# Patient Record
Sex: Male | Born: 1960
Health system: Southern US, Community
[De-identification: ages and names within clinical notes are randomized; demographics above are authoritative.]

## PROBLEM LIST (undated history)

## (undated) DIAGNOSIS — L0291 Cutaneous abscess, unspecified: Secondary | ICD-10-CM

## (undated) DIAGNOSIS — G062 Extradural and subdural abscess, unspecified: Secondary | ICD-10-CM

## (undated) DIAGNOSIS — J45909 Unspecified asthma, uncomplicated: Secondary | ICD-10-CM

---

## 2016-09-03 DIAGNOSIS — J208 Acute bronchitis due to other specified organisms: Secondary | ICD-10-CM | POA: Diagnosis not present

## 2016-09-03 DIAGNOSIS — J019 Acute sinusitis, unspecified: Secondary | ICD-10-CM | POA: Diagnosis not present

## 2016-09-10 DIAGNOSIS — J019 Acute sinusitis, unspecified: Secondary | ICD-10-CM | POA: Diagnosis not present

## 2016-09-10 DIAGNOSIS — J208 Acute bronchitis due to other specified organisms: Secondary | ICD-10-CM | POA: Diagnosis not present

## 2016-12-06 DIAGNOSIS — J019 Acute sinusitis, unspecified: Secondary | ICD-10-CM | POA: Diagnosis not present

## 2016-12-06 DIAGNOSIS — J208 Acute bronchitis due to other specified organisms: Secondary | ICD-10-CM | POA: Diagnosis not present

## 2017-01-15 DIAGNOSIS — K219 Gastro-esophageal reflux disease without esophagitis: Secondary | ICD-10-CM | POA: Diagnosis not present

## 2017-01-15 DIAGNOSIS — M62838 Other muscle spasm: Secondary | ICD-10-CM | POA: Diagnosis not present

## 2017-01-15 DIAGNOSIS — M5030 Other cervical disc degeneration, unspecified cervical region: Secondary | ICD-10-CM | POA: Diagnosis not present

## 2017-01-15 DIAGNOSIS — M47812 Spondylosis without myelopathy or radiculopathy, cervical region: Secondary | ICD-10-CM | POA: Diagnosis not present

## 2017-01-15 DIAGNOSIS — E7800 Pure hypercholesterolemia, unspecified: Secondary | ICD-10-CM | POA: Diagnosis not present

## 2017-01-15 DIAGNOSIS — M542 Cervicalgia: Secondary | ICD-10-CM | POA: Diagnosis not present

## 2017-02-12 DIAGNOSIS — Z0000 Encounter for general adult medical examination without abnormal findings: Secondary | ICD-10-CM | POA: Diagnosis not present

## 2017-02-12 DIAGNOSIS — M4302 Spondylolysis, cervical region: Secondary | ICD-10-CM | POA: Diagnosis not present

## 2017-02-12 DIAGNOSIS — E785 Hyperlipidemia, unspecified: Secondary | ICD-10-CM | POA: Diagnosis not present

## 2017-05-02 DIAGNOSIS — J208 Acute bronchitis due to other specified organisms: Secondary | ICD-10-CM | POA: Diagnosis not present

## 2017-05-02 DIAGNOSIS — J019 Acute sinusitis, unspecified: Secondary | ICD-10-CM | POA: Diagnosis not present

## 2017-05-03 DIAGNOSIS — M4302 Spondylolysis, cervical region: Secondary | ICD-10-CM | POA: Diagnosis not present

## 2017-05-03 DIAGNOSIS — M542 Cervicalgia: Secondary | ICD-10-CM | POA: Diagnosis not present

## 2017-05-14 DIAGNOSIS — M542 Cervicalgia: Secondary | ICD-10-CM | POA: Diagnosis not present

## 2017-05-14 DIAGNOSIS — M5030 Other cervical disc degeneration, unspecified cervical region: Secondary | ICD-10-CM | POA: Diagnosis not present

## 2017-06-05 DIAGNOSIS — Z23 Encounter for immunization: Secondary | ICD-10-CM | POA: Diagnosis not present

## 2017-06-17 DIAGNOSIS — M542 Cervicalgia: Secondary | ICD-10-CM | POA: Diagnosis not present

## 2017-07-08 DIAGNOSIS — J208 Acute bronchitis due to other specified organisms: Secondary | ICD-10-CM | POA: Diagnosis not present

## 2017-07-08 DIAGNOSIS — J019 Acute sinusitis, unspecified: Secondary | ICD-10-CM | POA: Diagnosis not present

## 2017-09-01 DIAGNOSIS — M542 Cervicalgia: Secondary | ICD-10-CM | POA: Diagnosis not present

## 2017-09-08 DIAGNOSIS — M9981 Other biomechanical lesions of cervical region: Secondary | ICD-10-CM | POA: Diagnosis not present

## 2017-09-08 DIAGNOSIS — M47812 Spondylosis without myelopathy or radiculopathy, cervical region: Secondary | ICD-10-CM | POA: Diagnosis not present

## 2017-09-21 DIAGNOSIS — R030 Elevated blood-pressure reading, without diagnosis of hypertension: Secondary | ICD-10-CM | POA: Diagnosis not present

## 2017-09-21 DIAGNOSIS — M47812 Spondylosis without myelopathy or radiculopathy, cervical region: Secondary | ICD-10-CM | POA: Diagnosis not present

## 2017-11-14 DIAGNOSIS — J208 Acute bronchitis due to other specified organisms: Secondary | ICD-10-CM | POA: Diagnosis not present

## 2017-11-14 DIAGNOSIS — J019 Acute sinusitis, unspecified: Secondary | ICD-10-CM | POA: Diagnosis not present

## 2017-12-21 DIAGNOSIS — M5431 Sciatica, right side: Secondary | ICD-10-CM | POA: Diagnosis not present

## 2018-01-12 DIAGNOSIS — J45909 Unspecified asthma, uncomplicated: Secondary | ICD-10-CM | POA: Diagnosis not present

## 2018-01-23 DIAGNOSIS — M5431 Sciatica, right side: Secondary | ICD-10-CM | POA: Diagnosis not present

## 2018-01-23 DIAGNOSIS — M5416 Radiculopathy, lumbar region: Secondary | ICD-10-CM | POA: Diagnosis not present

## 2018-01-24 DIAGNOSIS — M5416 Radiculopathy, lumbar region: Secondary | ICD-10-CM | POA: Diagnosis not present

## 2018-01-24 DIAGNOSIS — M545 Low back pain: Secondary | ICD-10-CM | POA: Diagnosis not present

## 2018-01-24 DIAGNOSIS — J45909 Unspecified asthma, uncomplicated: Secondary | ICD-10-CM | POA: Diagnosis not present

## 2018-01-24 DIAGNOSIS — M25551 Pain in right hip: Secondary | ICD-10-CM | POA: Diagnosis not present

## 2018-01-26 ENCOUNTER — Emergency Department (HOSPITAL_COMMUNITY)
Admission: EM | Admit: 2018-01-26 | Discharge: 2018-01-26 | Disposition: A | Discharge status: Home / Self Care | Payer: 59 | Attending: Emergency Medicine | Admitting: Emergency Medicine

## 2018-01-26 ENCOUNTER — Encounter (HOSPITAL_COMMUNITY): Payer: Self-pay | Admitting: Emergency Medicine

## 2018-01-26 ENCOUNTER — Other Ambulatory Visit: Payer: Self-pay

## 2018-01-26 DIAGNOSIS — M545 Low back pain: Secondary | ICD-10-CM | POA: Diagnosis not present

## 2018-01-26 DIAGNOSIS — F17210 Nicotine dependence, cigarettes, uncomplicated: Secondary | ICD-10-CM | POA: Insufficient documentation

## 2018-01-26 DIAGNOSIS — M5431 Sciatica, right side: Secondary | ICD-10-CM | POA: Diagnosis not present

## 2018-01-26 DIAGNOSIS — M5441 Lumbago with sciatica, right side: Secondary | ICD-10-CM | POA: Diagnosis not present

## 2018-01-26 MED ORDER — OXYCODONE-ACETAMINOPHEN 5-325 MG PO TABS
1.0000 | ORAL_TABLET | ORAL | Status: DC | PRN
  Administered 2018-01-26: 1 via ORAL
  Filled 2018-01-26: qty 1

## 2018-01-26 MED ORDER — KETOROLAC TROMETHAMINE 30 MG/ML IJ SOLN
30.0000 mg | Freq: Once | INTRAMUSCULAR | Status: AC
  Administered 2018-01-26: 30 mg via INTRAVENOUS
  Filled 2018-01-26: qty 1

## 2018-01-26 MED ORDER — HYDROMORPHONE HCL 2 MG/ML IJ SOLN
0.5000 mg | Freq: Once | INTRAMUSCULAR | Status: AC
  Administered 2018-01-26: 0.5 mg via INTRAVENOUS
  Filled 2018-01-26: qty 1

## 2018-01-26 NOTE — Discharge Instructions (Signed)
Please read attached information. If you experience any new or worsening signs or symptoms please return to the emergency room for evaluation. Please follow-up with your primary care provider or specialist as discussed. Please use medication prescribed only as directed and discontinue taking if you have any concerning signs or symptoms.   °

## 2018-01-26 NOTE — ED Notes (Signed)
See EDP assessment 

## 2018-01-26 NOTE — ED Triage Notes (Addendum)
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 Pain worsened over the weekend. Denies any injury. Pt ambulatory, but appears to be in a lot of pain. Pt was seen Tuesday for the same pain and given steroids that have no relieved his pain.

## 2018-01-26 NOTE — ED Provider Notes (Signed)
[facility] EMERGENCY DEPARTMENT Provider Note   CSN: [id number] Arrival date & time: 01/26/18  1625     History   Chief Complaint Chief Complaint  Patient presents with  . Back Pain    HPI Thomas Simpson is a 57 y.o. male.  HPI   57 year old male presents today with complaints of back pain.  Patient reports 1 week history of worsening right lower back pain with radiation down into his buttock and legs.  Patient notes symptoms are worse with movement, worse with elevation in the leg.  Denies any abdominal pain fever, IV drug use, trauma, or any other red flags.  Patient has been seen both by his primary care and orthopedics with negative x-rays of his back and hip.  He notes he was placed on steroids which did not improve his symptoms.  Also taking hydrocodone at home that has not been helping either.  Patient has follow-up appoint with neurosurgery in 1 week.       History reviewed. No pertinent past medical history.  There are no active problems to display for this patient.   History reviewed. No pertinent surgical history.      Home Medications    Prior to Admission medications   Not on File    Family History History reviewed. No pertinent family history.  Social History Social History   Tobacco Use  . Smoking status: Current Every Day Smoker    Types: Cigarettes  . Smokeless tobacco: Never Used  Substance Use Topics  . Alcohol use: Never    Frequency: Never  . Drug use: Never     Allergies   Patient has no known allergies.   Review of Systems Review of Systems  All other systems reviewed and are negative.    Physical Exam Updated Vital Signs BP (!) 188/87 (BP Location: Left Arm)   Pulse 85   Temp 97.7 F (36.5 C) (Oral)   Resp 16   Ht 5\' 9"  (1.753 m)   Wt 99.8 kg (220 lb)   SpO2 98%   BMI 32.49 kg/m   Physical Exam  Constitutional: He is oriented to person, place, and time. He appears well-developed and  well-nourished.  HENT:  Head: Normocephalic and atraumatic.  Eyes: Pupils are equal, round, and reactive to light. Conjunctivae are normal. Right eye exhibits no discharge. Left eye exhibits no discharge. No scleral icterus.  Neck: Normal range of motion. No JVD present. No tracheal deviation present.  Pulmonary/Chest: Effort normal. No stridor.  Musculoskeletal:  No CT or L-spine tenderness palpation, tenderness palpation of the right upper gluteus-bilateral lower extremity sensation strength motor function intact, straight leg positive right, distal perfusion intact  Neurological: He is alert and oriented to person, place, and time. Coordination normal.  Psychiatric: He has a normal mood and affect. His behavior is normal. Judgment and thought content normal.  Nursing note and vitals reviewed.    ED Treatments / Results  Labs (all labs ordered are listed, but only abnormal results are displayed) Labs Reviewed - No data to display  EKG None  Radiology No results found.  Procedures Procedures (including critical care time)  Medications Ordered in ED Medications  oxyCODONE-acetaminophen (PERCOCET/ROXICET) 5-325 MG per tablet 1 tablet (1 tablet Oral Given 01/26/18 1752)  HYDROmorphone (DILAUDID) injection 0.5 mg (0.5 mg Intravenous Given 01/26/18 2136)  ketorolac (TORADOL) 30 MG/ML injection 30 mg (30 mg Intravenous Given 01/26/18 2136)     Initial Impression / Assessment and Plan / ED Course  I have reviewed the triage vital signs and the nursing notes.  Pertinent labs & imaging results that were available during my care of the patient were reviewed by me and considered in my medical decision making (see chart for details).       Final Clinical Impressions(s) / ED Diagnoses   Final diagnoses:  Sciatica of right side    Labs:   Imaging:  Consults:  Therapeutics: Dilaudid, Toradol  Discharge Meds:   Assessment/Plan: 57 year old male presents today with complaints  of low back pain, likely sciatica; no red flags.  Patient has no indications for imaging at this time, but does appear to be acutely uncomfortable.  He will be treated symptomatically here with pain medication discharged with outpatient neurosurgery follow-up and strict return precautions.  Patient verbalized understanding and agreement to today's plan had no further questions or concerns.      ED Discharge Orders    None       Francee Gentile 01/26/18 2158    Charlesetta Shanks, MD 01/27/18 1429

## 2018-01-26 NOTE — ED Notes (Signed)
Pt verbalizes understanding of d/c instructions. Pt taken to lobby in wheelchair at d/c with all belongings and family.

## 2018-01-27 NOTE — ED Notes (Signed)
1.5mg  of Dilaudid wasted with Gretta Cool, RN after pt was discharged.

## 2018-02-02 DIAGNOSIS — M5416 Radiculopathy, lumbar region: Secondary | ICD-10-CM | POA: Diagnosis not present

## 2018-02-06 ENCOUNTER — Other Ambulatory Visit: Payer: Self-pay | Admitting: Student

## 2018-02-06 DIAGNOSIS — M5416 Radiculopathy, lumbar region: Secondary | ICD-10-CM

## 2018-02-08 ENCOUNTER — Ambulatory Visit
Admission: RE | Admit: 2018-02-08 | Discharge: 2018-02-08 | Disposition: A | Discharge status: Home / Self Care | Payer: 59 | Source: Ambulatory Visit | Attending: Student | Admitting: Student

## 2018-02-08 DIAGNOSIS — M5416 Radiculopathy, lumbar region: Secondary | ICD-10-CM

## 2018-02-08 DIAGNOSIS — M48061 Spinal stenosis, lumbar region without neurogenic claudication: Secondary | ICD-10-CM | POA: Diagnosis not present

## 2018-02-16 DIAGNOSIS — M5126 Other intervertebral disc displacement, lumbar region: Secondary | ICD-10-CM | POA: Diagnosis not present

## 2018-02-23 DIAGNOSIS — J45909 Unspecified asthma, uncomplicated: Secondary | ICD-10-CM | POA: Diagnosis not present

## 2018-03-24 DIAGNOSIS — M5126 Other intervertebral disc displacement, lumbar region: Secondary | ICD-10-CM | POA: Diagnosis not present

## 2018-03-24 HISTORY — PX: BACK SURGERY: SHX140

## 2018-04-23 DIAGNOSIS — Z23 Encounter for immunization: Secondary | ICD-10-CM | POA: Diagnosis not present

## 2018-04-26 DIAGNOSIS — J45909 Unspecified asthma, uncomplicated: Secondary | ICD-10-CM | POA: Diagnosis not present

## 2018-04-26 DIAGNOSIS — M5126 Other intervertebral disc displacement, lumbar region: Secondary | ICD-10-CM | POA: Diagnosis not present

## 2018-04-26 DIAGNOSIS — M545 Low back pain: Secondary | ICD-10-CM | POA: Diagnosis not present

## 2018-04-28 DIAGNOSIS — M545 Low back pain: Secondary | ICD-10-CM | POA: Diagnosis not present

## 2018-04-28 DIAGNOSIS — M5126 Other intervertebral disc displacement, lumbar region: Secondary | ICD-10-CM | POA: Diagnosis not present

## 2018-05-02 DIAGNOSIS — M545 Low back pain: Secondary | ICD-10-CM | POA: Diagnosis not present

## 2018-05-02 DIAGNOSIS — M5126 Other intervertebral disc displacement, lumbar region: Secondary | ICD-10-CM | POA: Diagnosis not present

## 2018-05-04 DIAGNOSIS — M5126 Other intervertebral disc displacement, lumbar region: Secondary | ICD-10-CM | POA: Diagnosis not present

## 2018-05-04 DIAGNOSIS — M545 Low back pain: Secondary | ICD-10-CM | POA: Diagnosis not present

## 2018-05-12 DIAGNOSIS — J019 Acute sinusitis, unspecified: Secondary | ICD-10-CM | POA: Diagnosis not present

## 2018-05-22 DIAGNOSIS — J208 Acute bronchitis due to other specified organisms: Secondary | ICD-10-CM | POA: Diagnosis not present

## 2018-05-26 DIAGNOSIS — J45909 Unspecified asthma, uncomplicated: Secondary | ICD-10-CM | POA: Diagnosis not present

## 2018-06-02 DIAGNOSIS — M5126 Other intervertebral disc displacement, lumbar region: Secondary | ICD-10-CM | POA: Diagnosis not present

## 2018-06-26 DIAGNOSIS — J45909 Unspecified asthma, uncomplicated: Secondary | ICD-10-CM | POA: Diagnosis not present

## 2018-07-26 DIAGNOSIS — J45909 Unspecified asthma, uncomplicated: Secondary | ICD-10-CM | POA: Diagnosis not present

## 2018-08-21 DIAGNOSIS — J019 Acute sinusitis, unspecified: Secondary | ICD-10-CM | POA: Diagnosis not present

## 2018-08-21 DIAGNOSIS — Z0000 Encounter for general adult medical examination without abnormal findings: Secondary | ICD-10-CM | POA: Diagnosis not present

## 2018-08-21 DIAGNOSIS — J208 Acute bronchitis due to other specified organisms: Secondary | ICD-10-CM | POA: Diagnosis not present

## 2018-08-22 ENCOUNTER — Other Ambulatory Visit: Payer: Self-pay | Admitting: Neurosurgery

## 2018-08-22 DIAGNOSIS — M5126 Other intervertebral disc displacement, lumbar region: Secondary | ICD-10-CM

## 2018-08-26 DIAGNOSIS — J45909 Unspecified asthma, uncomplicated: Secondary | ICD-10-CM | POA: Diagnosis not present

## 2018-09-05 ENCOUNTER — Ambulatory Visit
Admission: RE | Admit: 2018-09-05 | Discharge: 2018-09-05 | Disposition: A | Discharge status: Home / Self Care | Payer: 59 | Source: Ambulatory Visit | Attending: Neurosurgery | Admitting: Neurosurgery

## 2018-09-05 DIAGNOSIS — M5126 Other intervertebral disc displacement, lumbar region: Secondary | ICD-10-CM

## 2018-09-05 DIAGNOSIS — M545 Low back pain: Secondary | ICD-10-CM | POA: Diagnosis not present

## 2018-09-05 DIAGNOSIS — M5440 Lumbago with sciatica, unspecified side: Secondary | ICD-10-CM | POA: Diagnosis not present

## 2018-09-05 MED ORDER — GADOBENATE DIMEGLUMINE 529 MG/ML IV SOLN
20.0000 mL | Freq: Once | INTRAVENOUS | Status: AC | PRN
  Administered 2018-09-05: 20 mL via INTRAVENOUS

## 2018-09-21 DIAGNOSIS — L578 Other skin changes due to chronic exposure to nonionizing radiation: Secondary | ICD-10-CM | POA: Diagnosis not present

## 2018-09-21 DIAGNOSIS — C44311 Basal cell carcinoma of skin of nose: Secondary | ICD-10-CM | POA: Diagnosis not present

## 2018-09-21 DIAGNOSIS — C44619 Basal cell carcinoma of skin of left upper limb, including shoulder: Secondary | ICD-10-CM | POA: Diagnosis not present

## 2018-09-21 DIAGNOSIS — D2239 Melanocytic nevi of other parts of face: Secondary | ICD-10-CM | POA: Diagnosis not present

## 2018-09-21 DIAGNOSIS — C44319 Basal cell carcinoma of skin of other parts of face: Secondary | ICD-10-CM | POA: Diagnosis not present

## 2018-09-26 DIAGNOSIS — J45909 Unspecified asthma, uncomplicated: Secondary | ICD-10-CM | POA: Diagnosis not present

## 2018-10-06 DIAGNOSIS — M5416 Radiculopathy, lumbar region: Secondary | ICD-10-CM | POA: Diagnosis not present

## 2018-10-12 DIAGNOSIS — C44329 Squamous cell carcinoma of skin of other parts of face: Secondary | ICD-10-CM | POA: Diagnosis not present

## 2018-10-25 DIAGNOSIS — J45909 Unspecified asthma, uncomplicated: Secondary | ICD-10-CM | POA: Diagnosis not present

## 2018-10-26 DIAGNOSIS — M47816 Spondylosis without myelopathy or radiculopathy, lumbar region: Secondary | ICD-10-CM | POA: Diagnosis not present

## 2018-10-26 DIAGNOSIS — Z6829 Body mass index (BMI) 29.0-29.9, adult: Secondary | ICD-10-CM | POA: Diagnosis not present

## 2018-10-27 DIAGNOSIS — Z6832 Body mass index (BMI) 32.0-32.9, adult: Secondary | ICD-10-CM | POA: Diagnosis not present

## 2018-10-27 DIAGNOSIS — G4700 Insomnia, unspecified: Secondary | ICD-10-CM | POA: Diagnosis not present

## 2018-10-27 DIAGNOSIS — M5416 Radiculopathy, lumbar region: Secondary | ICD-10-CM | POA: Diagnosis not present

## 2018-10-30 DIAGNOSIS — K219 Gastro-esophageal reflux disease without esophagitis: Secondary | ICD-10-CM | POA: Diagnosis not present

## 2018-10-30 DIAGNOSIS — M545 Low back pain: Secondary | ICD-10-CM | POA: Diagnosis not present

## 2018-10-30 DIAGNOSIS — G8929 Other chronic pain: Secondary | ICD-10-CM | POA: Diagnosis not present

## 2018-10-30 DIAGNOSIS — R82998 Other abnormal findings in urine: Secondary | ICD-10-CM | POA: Diagnosis not present

## 2018-10-31 DIAGNOSIS — M47816 Spondylosis without myelopathy or radiculopathy, lumbar region: Secondary | ICD-10-CM | POA: Diagnosis not present

## 2018-11-21 DIAGNOSIS — R030 Elevated blood-pressure reading, without diagnosis of hypertension: Secondary | ICD-10-CM | POA: Diagnosis not present

## 2018-11-21 DIAGNOSIS — M47816 Spondylosis without myelopathy or radiculopathy, lumbar region: Secondary | ICD-10-CM | POA: Diagnosis not present

## 2018-11-21 DIAGNOSIS — M961 Postlaminectomy syndrome, not elsewhere classified: Secondary | ICD-10-CM | POA: Diagnosis not present

## 2018-11-22 DIAGNOSIS — L0291 Cutaneous abscess, unspecified: Secondary | ICD-10-CM

## 2018-11-22 HISTORY — DX: Cutaneous abscess, unspecified: L02.91

## 2018-11-25 DIAGNOSIS — J45909 Unspecified asthma, uncomplicated: Secondary | ICD-10-CM | POA: Diagnosis not present

## 2018-11-28 DIAGNOSIS — M5416 Radiculopathy, lumbar region: Secondary | ICD-10-CM | POA: Diagnosis not present

## 2018-11-28 DIAGNOSIS — M47816 Spondylosis without myelopathy or radiculopathy, lumbar region: Secondary | ICD-10-CM | POA: Diagnosis not present

## 2018-11-28 DIAGNOSIS — M5440 Lumbago with sciatica, unspecified side: Secondary | ICD-10-CM | POA: Diagnosis not present

## 2018-12-04 ENCOUNTER — Other Ambulatory Visit: Payer: Self-pay | Admitting: Neurosurgery

## 2018-12-04 DIAGNOSIS — M5416 Radiculopathy, lumbar region: Secondary | ICD-10-CM

## 2018-12-11 ENCOUNTER — Other Ambulatory Visit: Payer: Self-pay

## 2018-12-11 ENCOUNTER — Ambulatory Visit
Admission: RE | Admit: 2018-12-11 | Discharge: 2018-12-11 | Disposition: A | Discharge status: Home / Self Care | Payer: 59 | Source: Ambulatory Visit | Attending: Neurosurgery | Admitting: Neurosurgery

## 2018-12-11 DIAGNOSIS — M545 Low back pain: Secondary | ICD-10-CM | POA: Diagnosis not present

## 2018-12-11 DIAGNOSIS — M5416 Radiculopathy, lumbar region: Secondary | ICD-10-CM

## 2018-12-11 MED ORDER — GADOBENATE DIMEGLUMINE 529 MG/ML IV SOLN
17.0000 mL | Freq: Once | INTRAVENOUS | Status: AC | PRN
  Administered 2018-12-11: 17 mL via INTRAVENOUS

## 2018-12-12 ENCOUNTER — Other Ambulatory Visit: Payer: Self-pay

## 2018-12-12 ENCOUNTER — Other Ambulatory Visit: Payer: Self-pay | Admitting: Neurosurgery

## 2018-12-12 ENCOUNTER — Inpatient Hospital Stay (HOSPITAL_COMMUNITY)
Admission: AD | Admit: 2018-12-12 | Discharge: 2018-12-15 | DRG: 029 | Disposition: A | Discharge status: Home Health | Payer: 59 | Source: Ambulatory Visit | Attending: Neurosurgery | Admitting: Neurosurgery

## 2018-12-12 ENCOUNTER — Encounter (HOSPITAL_COMMUNITY): Payer: Self-pay | Admitting: General Practice

## 2018-12-12 DIAGNOSIS — M4640 Discitis, unspecified, site unspecified: Secondary | ICD-10-CM

## 2018-12-12 DIAGNOSIS — Z7952 Long term (current) use of systemic steroids: Secondary | ICD-10-CM

## 2018-12-12 DIAGNOSIS — G061 Intraspinal abscess and granuloma: Principal | ICD-10-CM | POA: Diagnosis present

## 2018-12-12 DIAGNOSIS — M869 Osteomyelitis, unspecified: Secondary | ICD-10-CM | POA: Diagnosis present

## 2018-12-12 DIAGNOSIS — M4646 Discitis, unspecified, lumbar region: Secondary | ICD-10-CM | POA: Diagnosis present

## 2018-12-12 DIAGNOSIS — F17210 Nicotine dependence, cigarettes, uncomplicated: Secondary | ICD-10-CM | POA: Diagnosis not present

## 2018-12-12 DIAGNOSIS — D72829 Elevated white blood cell count, unspecified: Secondary | ICD-10-CM | POA: Diagnosis not present

## 2018-12-12 DIAGNOSIS — B9562 Methicillin resistant Staphylococcus aureus infection as the cause of diseases classified elsewhere: Secondary | ICD-10-CM | POA: Diagnosis not present

## 2018-12-12 DIAGNOSIS — M4626 Osteomyelitis of vertebra, lumbar region: Secondary | ICD-10-CM | POA: Diagnosis not present

## 2018-12-12 DIAGNOSIS — Z419 Encounter for procedure for purposes other than remedying health state, unspecified: Secondary | ICD-10-CM

## 2018-12-12 DIAGNOSIS — J45909 Unspecified asthma, uncomplicated: Secondary | ICD-10-CM | POA: Diagnosis present

## 2018-12-12 DIAGNOSIS — G062 Extradural and subdural abscess, unspecified: Secondary | ICD-10-CM | POA: Diagnosis not present

## 2018-12-12 DIAGNOSIS — Z720 Tobacco use: Secondary | ICD-10-CM | POA: Diagnosis not present

## 2018-12-12 DIAGNOSIS — M549 Dorsalgia, unspecified: Secondary | ICD-10-CM | POA: Diagnosis not present

## 2018-12-12 DIAGNOSIS — Z95828 Presence of other vascular implants and grafts: Secondary | ICD-10-CM | POA: Diagnosis not present

## 2018-12-12 DIAGNOSIS — Z978 Presence of other specified devices: Secondary | ICD-10-CM | POA: Diagnosis not present

## 2018-12-12 HISTORY — DX: Cutaneous abscess, unspecified: L02.91

## 2018-12-12 HISTORY — DX: Extradural and subdural abscess, unspecified: G06.2

## 2018-12-12 HISTORY — DX: Unspecified asthma, uncomplicated: J45.909

## 2018-12-12 LAB — CBC WITH DIFFERENTIAL/PLATELET
Abs Immature Granulocytes: 0.09 10*3/uL — ABNORMAL HIGH (ref 0.00–0.07)
Basophils Absolute: 0.1 10*3/uL (ref 0.0–0.1)
Basophils Relative: 0 %
Eosinophils Absolute: 0.1 10*3/uL (ref 0.0–0.5)
Eosinophils Relative: 1 %
HCT: 45.5 % (ref 39.0–52.0)
Hemoglobin: 15.6 g/dL (ref 13.0–17.0)
Immature Granulocytes: 1 %
Lymphocytes Relative: 12 %
Lymphs Abs: 1.9 10*3/uL (ref 0.7–4.0)
MCH: 29.6 pg (ref 26.0–34.0)
MCHC: 34.3 g/dL (ref 30.0–36.0)
MCV: 86.3 fL (ref 80.0–100.0)
Monocytes Absolute: 0.6 10*3/uL (ref 0.1–1.0)
Monocytes Relative: 4 %
Neutro Abs: 13.8 10*3/uL — ABNORMAL HIGH (ref 1.7–7.7)
Neutrophils Relative %: 82 %
Platelets: 406 10*3/uL — ABNORMAL HIGH (ref 150–400)
RBC: 5.27 MIL/uL (ref 4.22–5.81)
RDW: 13.2 % (ref 11.5–15.5)
WBC: 16.5 10*3/uL — ABNORMAL HIGH (ref 4.0–10.5)
nRBC: 0 % (ref 0.0–0.2)

## 2018-12-12 LAB — BASIC METABOLIC PANEL
Anion gap: 15 (ref 5–15)
BUN: 12 mg/dL (ref 6–20)
CO2: 23 mmol/L (ref 22–32)
Calcium: 9.7 mg/dL (ref 8.9–10.3)
Chloride: 101 mmol/L (ref 98–111)
Creatinine, Ser: 0.74 mg/dL (ref 0.61–1.24)
GFR calc Af Amer: >60 mL/min (ref >60)
GFR calc non Af Amer: >60 mL/min (ref >60)
Glucose, Bld: 155 mg/dL — ABNORMAL HIGH (ref 70–99)
Potassium: 3.8 mmol/L (ref 3.5–5.1)
Sodium: 139 mmol/L (ref 135–145)

## 2018-12-12 LAB — SEDIMENTATION RATE: Sed Rate: 54 mm/hr — ABNORMAL HIGH (ref 0–16)

## 2018-12-12 LAB — PROTIME-INR
INR: 1.1 (ref 0.8–1.2)
Prothrombin Time: 13.6 seconds (ref 11.4–15.2)

## 2018-12-12 LAB — C-REACTIVE PROTEIN: CRP: 6.7 mg/dL — ABNORMAL HIGH (ref <1.0)

## 2018-12-12 MED ORDER — SODIUM CHLORIDE 0.9% FLUSH: 3.0000 mL | INTRAVENOUS | Status: DC | PRN

## 2018-12-12 MED ORDER — ACETAMINOPHEN 325 MG PO TABS: 650.0000 mg | ORAL_TABLET | ORAL | Status: DC | PRN

## 2018-12-12 MED ORDER — PANTOPRAZOLE SODIUM 40 MG PO TBEC
40.0000 mg | DELAYED_RELEASE_TABLET | Freq: Every day | ORAL | Status: DC
  Administered 2018-12-13: 40 mg via ORAL
  Filled 2018-12-12: qty 1

## 2018-12-12 MED ORDER — METHYLPREDNISOLONE 4 MG PO TABS
4.0000 mg | ORAL_TABLET | Freq: Every day | ORAL | Status: DC
  Administered 2018-12-13 – 2018-12-15 (×3): 4 mg via ORAL
  Filled 2018-12-12 (×3): qty 1

## 2018-12-12 MED ORDER — PANTOPRAZOLE SODIUM 40 MG IV SOLR: 40.0000 mg | Freq: Every day | INTRAVENOUS | Status: DC

## 2018-12-12 MED ORDER — ALUM & MAG HYDROXIDE-SIMETH 200-200-20 MG/5ML PO SUSP: 30.0000 mL | Freq: Four times a day (QID) | ORAL | Status: DC | PRN

## 2018-12-12 MED ORDER — ONDANSETRON HCL 4 MG/2ML IJ SOLN: 4.0000 mg | Freq: Four times a day (QID) | INTRAMUSCULAR | Status: DC | PRN

## 2018-12-12 MED ORDER — ENSURE ENLIVE PO LIQD
237.0000 mL | Freq: Two times a day (BID) | ORAL | Status: DC
  Administered 2018-12-14 – 2018-12-15 (×4): 237 mL via ORAL

## 2018-12-12 MED ORDER — CYCLOBENZAPRINE HCL 10 MG PO TABS
10.0000 mg | ORAL_TABLET | Freq: Three times a day (TID) | ORAL | Status: DC
  Administered 2018-12-13 – 2018-12-15 (×9): 10 mg via ORAL
  Filled 2018-12-12 (×8): qty 1

## 2018-12-12 MED ORDER — NICOTINE 21 MG/24HR TD PT24
21.0000 mg | MEDICATED_PATCH | Freq: Every day | TRANSDERMAL | Status: DC
  Administered 2018-12-13 – 2018-12-15 (×4): 21 mg via TRANSDERMAL
  Filled 2018-12-12 (×4): qty 1

## 2018-12-12 MED ORDER — ACETAMINOPHEN 650 MG RE SUPP: 650.0000 mg | RECTAL | Status: DC | PRN

## 2018-12-12 MED ORDER — HYDROMORPHONE HCL 1 MG/ML IJ SOLN: 0.5000 mg | INTRAMUSCULAR | Status: DC | PRN

## 2018-12-12 MED ORDER — PHENOL 1.4 % MT LIQD: 1.0000 | OROMUCOSAL | Status: DC | PRN

## 2018-12-12 MED ORDER — TRAMADOL HCL 50 MG PO TABS
50.0000 mg | ORAL_TABLET | Freq: Three times a day (TID) | ORAL | Status: DC
  Administered 2018-12-13 – 2018-12-15 (×9): 50 mg via ORAL
  Filled 2018-12-12 (×9): qty 1

## 2018-12-12 MED ORDER — SODIUM CHLORIDE 0.9% FLUSH
3.0000 mL | Freq: Two times a day (BID) | INTRAVENOUS | Status: DC
  Administered 2018-12-12 – 2018-12-14 (×4): 3 mL via INTRAVENOUS

## 2018-12-12 MED ORDER — CYCLOBENZAPRINE HCL 10 MG PO TABS
10.0000 mg | ORAL_TABLET | Freq: Three times a day (TID) | ORAL | Status: DC | PRN
  Administered 2018-12-12 – 2018-12-13 (×2): 10 mg via ORAL
  Filled 2018-12-12 (×3): qty 1

## 2018-12-12 MED ORDER — OXYCODONE HCL 5 MG PO TABS
10.0000 mg | ORAL_TABLET | ORAL | Status: DC | PRN
  Administered 2018-12-12 – 2018-12-15 (×7): 10 mg via ORAL
  Filled 2018-12-12 (×7): qty 2

## 2018-12-12 MED ORDER — ONDANSETRON HCL 4 MG PO TABS: 4.0000 mg | ORAL_TABLET | Freq: Four times a day (QID) | ORAL | Status: DC | PRN

## 2018-12-12 MED ORDER — MENTHOL 3 MG MT LOZG: 1.0000 | LOZENGE | OROMUCOSAL | Status: DC | PRN

## 2018-12-12 MED ORDER — SODIUM CHLORIDE 0.9 % IV SOLN: 250.0000 mL | INTRAVENOUS | Status: DC

## 2018-12-12 NOTE — H&P (Signed)
Thomas Simpson is an 58 y.o. male.   Chief Complaint: Back left hip and leg pain HPI: Patient is a very pleasant 58 year old gentleman who previously undergone a right-sided L4-5 laminectomy microdiscectomy several months ago.  Initially patient did very well however started getting worsening back and little bit of left hip and leg pain had a follow-up MRI scan done in January that showed some spondylosis and was referred for injections.  Had a couple rounds of injections and then after the second injection started to get progressive worse with his back pain had an MRI scan ordered however the patient was not able to go get the MRI scan the first time he was ordered we will reorder that ultimately he got it and the MRI scan is shown what looks like severe discitis osteomyelitis with a component causing an epidural abscess and compression of the left L4 nerve root.  Patient symptomatology is primarily back pain left hip and leg pain that radiates down an L4 distribution.  Patient denies any fevers chills nausea vomiting any other numbness or tingling denies any bowel or bladder difficulty.  Past Medical History:  Diagnosis Date  . Abscess 11/2018   LUMBAR  . Asthma     Past Surgical History:  Procedure Laterality Date  . BACK SURGERY  03/24/2018    History reviewed. No pertinent family history. Social History:  reports that he has been smoking cigarettes. He has a 30.00 pack-year smoking history. He has never used smokeless tobacco. He reports that he does not drink alcohol or use drugs.  Allergies: No Known Allergies  Medications Prior to Admission  Medication Sig Dispense Refill  . cyclobenzaprine (FLEXERIL) 10 MG tablet Take 10 mg by mouth 3 (three) times daily.    Marland Kitchen dexlansoprazole (DEXILANT) 60 MG capsule Take 60 mg by mouth every morning.    . diclofenac (VOLTAREN) 75 MG EC tablet Take 75 mg by mouth every morning.    Marnee Spring Omega-3 Krill Oil 500 MG CAPS Take 500 mg elemental  calcium/kg/hr by mouth every morning.    . methylPREDNISolone (MEDROL) 4 MG tablet Take 4 mg by mouth daily.    . traMADol (ULTRAM) 50 MG tablet Take 50 mg by mouth 3 (three) times daily.      No results found for this or any previous visit (from the past 48 hour(s)). Mr Lumbar Spine W Wo Contrast  Result Date: 12/11/2018 CLINICAL DATA:  Severe low back pain radiating down the left leg for the past 2 months. Prior surgery in August 2019. EXAM: MRI LUMBAR SPINE WITHOUT AND WITH CONTRAST TECHNIQUE: Multiplanar and multiecho pulse sequences of the lumbar spine were obtained without and with intravenous contrast. CONTRAST:  48mL MULTIHANCE GADOBENATE DIMEGLUMINE 529 MG/ML IV SOLN COMPARISON:  MR lumbar spine dated September 05, 2018. FINDINGS: Segmentation:  Standard. Alignment: Unchanged mild dextroscoliosis. Sagittal alignment is maintained. Vertebrae: New osteomyelitis discitis at L3-L4 with fluid and enhancement of the disc space, erosion of the endplates, and confluent marrow edema with decreased T1 marrow signal throughout both L3 and L4 vertebral bodies. Mild right-sided endplate marrow edema and enhancement at L4-L5 is unchanged likely degenerative. No fracture or suspicious bone lesion. Conus medullaris and cauda equina: Conus extends to the L1 level. Conus and cauda equina appear normal. No intradural enhancement. Paraspinal and other soft tissues: Prominent paravertebral inflammatory changes at L3-L4 extending into both psoas muscles with tiny 5 mm rim enhancing fluid collection in the left psoas muscle. There is a larger incompletely  visualized 1.6 cm abscess in the inferior left psoas muscle at the level of the sacral ala. Prominent anterior epidural phlegmon extending from the superior L3 endplate to the inferior L4 endplate with small 6 x 3 x 8 mm epidural abscess on the left behind the L3 vertebral body. Disc levels: T12-L1:  Negative. L1-L2:  Negative. L2-L3: Negative disc. New severe left lateral  recess stenosis due to epidural phlegmon. No spinal canal or neuroforaminal stenosis. L3-L4: New severe spinal canal and bilateral lateral recess stenosis due to disc bulging and anterior epidural phlegmon. Progressive moderate to severe left and severe right neuroforaminal stenosis. L4-L5: Prior right hemilaminectomy. Slightly decreased enhancing granulation tissue along the right epidural space extending into the right subarticular zone inferiorly, adjacent to the descending right L5 nerve root as it exits the thecal sac. There is a new shallow left subarticular and foraminal disc protrusion. Unchanged mild bilateral facet arthropathy. New mild to moderate left lateral recess stenosis. Unchanged moderate right and mild-to-moderate left neuroforaminal stenosis. Resolved spinal canal stenosis. L5-S1: Unchanged shallow broad-based posterior disc protrusion with annular fissure. No stenosis. IMPRESSION: 1. New osteomyelitis discitis at L3-L4 with prominent anterior epidural phlegmon and small 6 x 3 x 8 mm epidural abscess resulting in severe spinal canal and lateral recess stenosis with progressive moderate to severe left and severe right neuroforaminal stenosis. Additional new severe left lateral recess stenosis at L2-L3 due to epidural phlegmon. 2. Prominent paravertebral inflammatory changes at L3-L4 with tiny 5 mm abscess in the left psoas muscle at this level and larger, incompletely visualized 1.6 cm abscess in the inferior left psoas muscle at the level of the sacral ala. 3. Decreasing granulation tissue in the surgical bed and right subarticular zone at L4-L5. New shallow left sided disc protrusion at this level with new mild to moderate left lateral recess stenosis. Unchanged moderate right and mild-to-moderate left neuroforaminal stenosis. These results will be called to the ordering clinician or representative by the Radiologist Assistant, and communication documented in the PACS or zVision Dashboard.  Electronically Signed   By: Titus Dubin M.D.   On: 12/11/2018 16:44    Review of Systems  Musculoskeletal: Positive for back pain.  Neurological: Positive for tingling and sensory change.    Blood pressure (!) 159/96, pulse (!) 109, temperature 98.2 F (36.8 C), temperature source Oral, resp. rate 16, SpO2 99 %. Physical Exam  Constitutional: He is oriented to person, place, and time. He appears well-developed and well-nourished.  HENT:  Head: Normocephalic.  Eyes: Pupils are equal, round, and reactive to light.  Neck: Normal range of motion.  GI: Soft. Bowel sounds are normal.  Neurological: He is alert and oriented to person, place, and time. He has normal strength. GCS eye subscore is 4. GCS verbal subscore is 5. GCS motor subscore is 6.  Strength is 5 out of 5 iliopsoas, quads, hamstrings, gastroc, and tibialis, EHL.     Assessment/Plan 58 year old gentleman with MRI that looks like discitis osteomyelitis and epidural abscess we have admitted the patient for blood work blood cultures sed rate C-reactive protein and we have preop him for laminectomy for decompression as well as diagnosis in the morning.  I have extensively gone over the risks and benefits of that operation with him as well as perioperative course expectations of outcome and alternatives of surgery and he understands and agrees to proceed forward.  Anette Barra P, MD 12/12/2018, 3:45 PM

## 2018-12-13 ENCOUNTER — Encounter (HOSPITAL_COMMUNITY)
Admission: AD | Disposition: A | Discharge status: Home Health | Payer: Self-pay | Source: Ambulatory Visit | Attending: Neurosurgery

## 2018-12-13 ENCOUNTER — Inpatient Hospital Stay (HOSPITAL_COMMUNITY): Payer: 59 | Admitting: Certified Registered Nurse Anesthetist

## 2018-12-13 ENCOUNTER — Inpatient Hospital Stay (HOSPITAL_COMMUNITY): Payer: 59

## 2018-12-13 ENCOUNTER — Inpatient Hospital Stay (HOSPITAL_COMMUNITY): Admission: RE | Admit: 2018-12-13 | Payer: 59 | Source: Home / Self Care | Admitting: Neurosurgery

## 2018-12-13 ENCOUNTER — Encounter (HOSPITAL_COMMUNITY): Payer: Self-pay

## 2018-12-13 DIAGNOSIS — F17210 Nicotine dependence, cigarettes, uncomplicated: Secondary | ICD-10-CM

## 2018-12-13 DIAGNOSIS — Z978 Presence of other specified devices: Secondary | ICD-10-CM

## 2018-12-13 DIAGNOSIS — M4640 Discitis, unspecified, site unspecified: Secondary | ICD-10-CM

## 2018-12-13 DIAGNOSIS — D72829 Elevated white blood cell count, unspecified: Secondary | ICD-10-CM

## 2018-12-13 DIAGNOSIS — G061 Intraspinal abscess and granuloma: Principal | ICD-10-CM

## 2018-12-13 DIAGNOSIS — M4646 Discitis, unspecified, lumbar region: Secondary | ICD-10-CM

## 2018-12-13 HISTORY — PX: LUMBAR LAMINECTOMY/DECOMPRESSION MICRODISCECTOMY: SHX5026

## 2018-12-13 LAB — SURGICAL PCR SCREEN
MRSA, PCR: POSITIVE — AB
Staphylococcus aureus: POSITIVE — AB

## 2018-12-13 SURGERY — LUMBAR LAMINECTOMY/DECOMPRESSION MICRODISCECTOMY 1 LEVEL
Anesthesia: General | Site: Back | Laterality: Left

## 2018-12-13 MED ORDER — CHLORHEXIDINE GLUCONATE CLOTH 2 % EX PADS: 6.0000 | MEDICATED_PAD | Freq: Once | CUTANEOUS | Status: DC

## 2018-12-13 MED ORDER — FENTANYL CITRATE (PF) 250 MCG/5ML IJ SOLN
INTRAMUSCULAR | Status: DC | PRN
  Administered 2018-12-13: 100 ug via INTRAVENOUS
  Administered 2018-12-13 (×2): 25 ug via INTRAVENOUS
  Administered 2018-12-13 (×2): 50 ug via INTRAVENOUS

## 2018-12-13 MED ORDER — SODIUM CHLORIDE 0.9 % IV SOLN
2.0000 g | INTRAVENOUS | Status: DC
  Administered 2018-12-13 – 2018-12-14 (×2): 2 g via INTRAVENOUS
  Filled 2018-12-13 (×3): qty 20

## 2018-12-13 MED ORDER — CEFAZOLIN SODIUM-DEXTROSE 2-3 GM-%(50ML) IV SOLR
INTRAVENOUS | Status: DC | PRN
  Administered 2018-12-13: 2 g via INTRAVENOUS

## 2018-12-13 MED ORDER — SODIUM CHLORIDE 0.9 % IV SOLN: 250.0000 mL | INTRAVENOUS | Status: DC

## 2018-12-13 MED ORDER — VANCOMYCIN HCL 500 MG IV SOLR
500.0000 mg | INTRAVENOUS | Status: DC
  Administered 2018-12-13: 17:00:00 500 mg via INTRAVENOUS
  Filled 2018-12-13: qty 500

## 2018-12-13 MED ORDER — SODIUM CHLORIDE 0.9% FLUSH
3.0000 mL | Freq: Two times a day (BID) | INTRAVENOUS | Status: DC
  Administered 2018-12-13 – 2018-12-15 (×4): 3 mL via INTRAVENOUS

## 2018-12-13 MED ORDER — ONDANSETRON HCL 4 MG/2ML IJ SOLN: 4.0000 mg | Freq: Four times a day (QID) | INTRAMUSCULAR | Status: DC | PRN

## 2018-12-13 MED ORDER — THROMBIN 5000 UNITS EX SOLR
OROMUCOSAL | Status: DC | PRN
  Administered 2018-12-13: 15:00:00 via TOPICAL

## 2018-12-13 MED ORDER — SODIUM CHLORIDE 0.9 % IV SOLN
INTRAVENOUS | Status: DC | PRN
  Administered 2018-12-13: 13:00:00

## 2018-12-13 MED ORDER — MENTHOL 3 MG MT LOZG: 1.0000 | LOZENGE | OROMUCOSAL | Status: DC | PRN

## 2018-12-13 MED ORDER — CHLORHEXIDINE GLUCONATE CLOTH 2 % EX PADS
6.0000 | MEDICATED_PAD | Freq: Every day | CUTANEOUS | Status: DC
  Administered 2018-12-15: 07:00:00 6 via TOPICAL

## 2018-12-13 MED ORDER — PROPOFOL 10 MG/ML IV BOLUS
INTRAVENOUS | Status: AC
  Filled 2018-12-13: qty 20

## 2018-12-13 MED ORDER — ALBUTEROL SULFATE (2.5 MG/3ML) 0.083% IN NEBU: 2.5000 mg | INHALATION_SOLUTION | Freq: Four times a day (QID) | RESPIRATORY_TRACT | Status: DC | PRN

## 2018-12-13 MED ORDER — LIDOCAINE-EPINEPHRINE 1 %-1:100000 IJ SOLN
INTRAMUSCULAR | Status: AC
  Filled 2018-12-13: qty 1

## 2018-12-13 MED ORDER — PHENYLEPHRINE HCL (PRESSORS) 10 MG/ML IV SOLN
INTRAVENOUS | Status: DC | PRN
  Administered 2018-12-13: 80 ug via INTRAVENOUS

## 2018-12-13 MED ORDER — THROMBIN 5000 UNITS EX SOLR
CUTANEOUS | Status: AC
  Filled 2018-12-13: qty 5000

## 2018-12-13 MED ORDER — ACETAMINOPHEN 325 MG PO TABS: 650.0000 mg | ORAL_TABLET | ORAL | Status: DC | PRN

## 2018-12-13 MED ORDER — ACETAMINOPHEN 650 MG RE SUPP: 650.0000 mg | RECTAL | Status: DC | PRN

## 2018-12-13 MED ORDER — VANCOMYCIN HCL IN DEXTROSE 1-5 GM/200ML-% IV SOLN
1000.0000 mg | Freq: Once | INTRAVENOUS | Status: DC
  Filled 2018-12-13 (×2): qty 200

## 2018-12-13 MED ORDER — ONDANSETRON HCL 4 MG/2ML IJ SOLN
INTRAMUSCULAR | Status: DC | PRN
  Administered 2018-12-13: 4 mg via INTRAVENOUS

## 2018-12-13 MED ORDER — LACTATED RINGERS IV SOLN
INTRAVENOUS | Status: DC
  Administered 2018-12-13 (×2): via INTRAVENOUS

## 2018-12-13 MED ORDER — VANCOMYCIN HCL 1000 MG IV SOLR
1000.0000 mg | Freq: Once | INTRAVENOUS | Status: DC
  Filled 2018-12-13: qty 1000

## 2018-12-13 MED ORDER — PANTOPRAZOLE SODIUM 40 MG IV SOLR
40.0000 mg | Freq: Every day | INTRAVENOUS | Status: DC
  Administered 2018-12-13: 22:00:00 40 mg via INTRAVENOUS
  Filled 2018-12-13: qty 40

## 2018-12-13 MED ORDER — BUPIVACAINE HCL (PF) 0.25 % IJ SOLN
INTRAMUSCULAR | Status: AC
  Filled 2018-12-13: qty 30

## 2018-12-13 MED ORDER — MIDAZOLAM HCL 2 MG/2ML IJ SOLN
INTRAMUSCULAR | Status: AC
  Filled 2018-12-13: qty 2

## 2018-12-13 MED ORDER — 0.9 % SODIUM CHLORIDE (POUR BTL) OPTIME
TOPICAL | Status: DC | PRN
  Administered 2018-12-13: 13:00:00 1000 mL

## 2018-12-13 MED ORDER — MIDAZOLAM HCL 2 MG/2ML IJ SOLN
INTRAMUSCULAR | Status: DC | PRN
  Administered 2018-12-13: 2 mg via INTRAVENOUS

## 2018-12-13 MED ORDER — VANCOMYCIN HCL 10 G IV SOLR
1250.0000 mg | Freq: Two times a day (BID) | INTRAVENOUS | Status: DC
  Administered 2018-12-14 – 2018-12-15 (×4): 1250 mg via INTRAVENOUS
  Filled 2018-12-13 (×5): qty 1250

## 2018-12-13 MED ORDER — SODIUM CHLORIDE 0.9% FLUSH: 3.0000 mL | INTRAVENOUS | Status: DC | PRN

## 2018-12-13 MED ORDER — ONDANSETRON HCL 4 MG PO TABS: 4.0000 mg | ORAL_TABLET | Freq: Four times a day (QID) | ORAL | Status: DC | PRN

## 2018-12-13 MED ORDER — FENTANYL CITRATE (PF) 100 MCG/2ML IJ SOLN: 25.0000 ug | INTRAMUSCULAR | Status: DC | PRN

## 2018-12-13 MED ORDER — ROCURONIUM BROMIDE 50 MG/5ML IV SOSY
PREFILLED_SYRINGE | INTRAVENOUS | Status: AC
  Filled 2018-12-13: qty 5

## 2018-12-13 MED ORDER — THROMBIN 5000 UNITS EX SOLR
CUTANEOUS | Status: AC
  Filled 2018-12-13: qty 10000

## 2018-12-13 MED ORDER — FENTANYL CITRATE (PF) 250 MCG/5ML IJ SOLN
INTRAMUSCULAR | Status: AC
  Filled 2018-12-13: qty 5

## 2018-12-13 MED ORDER — SUGAMMADEX SODIUM 200 MG/2ML IV SOLN
INTRAVENOUS | Status: DC | PRN
  Administered 2018-12-13: 158.4 mg via INTRAVENOUS

## 2018-12-13 MED ORDER — ALUM & MAG HYDROXIDE-SIMETH 200-200-20 MG/5ML PO SUSP: 30.0000 mL | Freq: Four times a day (QID) | ORAL | Status: DC | PRN

## 2018-12-13 MED ORDER — DEXAMETHASONE SODIUM PHOSPHATE 10 MG/ML IJ SOLN
INTRAMUSCULAR | Status: AC
  Filled 2018-12-13: qty 1

## 2018-12-13 MED ORDER — OXYCODONE HCL 5 MG PO TABS: 5.0000 mg | ORAL_TABLET | Freq: Once | ORAL | Status: DC | PRN

## 2018-12-13 MED ORDER — OXYCODONE HCL 5 MG/5ML PO SOLN: 5.0000 mg | Freq: Once | ORAL | Status: DC | PRN

## 2018-12-13 MED ORDER — DEXAMETHASONE SODIUM PHOSPHATE 10 MG/ML IJ SOLN
INTRAMUSCULAR | Status: DC | PRN
  Administered 2018-12-13: 5 mg via INTRAVENOUS

## 2018-12-13 MED ORDER — HEMOSTATIC AGENTS (NO CHARGE) OPTIME
TOPICAL | Status: DC | PRN
  Administered 2018-12-13: 1 via TOPICAL

## 2018-12-13 MED ORDER — PHENOL 1.4 % MT LIQD: 1.0000 | OROMUCOSAL | Status: DC | PRN

## 2018-12-13 MED ORDER — SUCCINYLCHOLINE CHLORIDE 200 MG/10ML IV SOSY
PREFILLED_SYRINGE | INTRAVENOUS | Status: DC | PRN
  Administered 2018-12-13: 120 mg via INTRAVENOUS

## 2018-12-13 MED ORDER — LIDOCAINE-EPINEPHRINE 1 %-1:100000 IJ SOLN
INTRAMUSCULAR | Status: DC | PRN
  Administered 2018-12-13: 10 mL

## 2018-12-13 MED ORDER — VITAMIN C 250 MG PO TABS
ORAL_TABLET | Freq: Every day | ORAL | Status: DC
  Administered 2018-12-14 – 2018-12-15 (×2): 250 mg via ORAL
  Filled 2018-12-13 (×3): qty 1

## 2018-12-13 MED ORDER — SUCCINYLCHOLINE CHLORIDE 200 MG/10ML IV SOSY
PREFILLED_SYRINGE | INTRAVENOUS | Status: AC
  Filled 2018-12-13: qty 10

## 2018-12-13 MED ORDER — CYCLOBENZAPRINE HCL 10 MG PO TABS: 10.0000 mg | ORAL_TABLET | Freq: Three times a day (TID) | ORAL | Status: DC | PRN

## 2018-12-13 MED ORDER — PROPOFOL 10 MG/ML IV BOLUS
INTRAVENOUS | Status: DC | PRN
  Administered 2018-12-13: 180 mg via INTRAVENOUS

## 2018-12-13 MED ORDER — THROMBIN 5000 UNITS EX SOLR
CUTANEOUS | Status: DC | PRN
  Administered 2018-12-13 (×2): 5000 [IU] via TOPICAL

## 2018-12-13 MED ORDER — LIDOCAINE 2% (20 MG/ML) 5 ML SYRINGE
INTRAMUSCULAR | Status: AC
  Filled 2018-12-13: qty 5

## 2018-12-13 MED ORDER — ROCURONIUM BROMIDE 50 MG/5ML IV SOSY
PREFILLED_SYRINGE | INTRAVENOUS | Status: DC | PRN
  Administered 2018-12-13: 50 mg via INTRAVENOUS

## 2018-12-13 MED ORDER — ONDANSETRON HCL 4 MG/2ML IJ SOLN
INTRAMUSCULAR | Status: AC
  Filled 2018-12-13: qty 2

## 2018-12-13 MED ORDER — MUPIROCIN 2 % EX OINT
1.0000 "application " | TOPICAL_OINTMENT | Freq: Two times a day (BID) | CUTANEOUS | Status: DC
  Administered 2018-12-13 – 2018-12-15 (×7): 1 via NASAL
  Filled 2018-12-13: qty 22

## 2018-12-13 MED ORDER — BUPIVACAINE HCL (PF) 0.25 % IJ SOLN
INTRAMUSCULAR | Status: DC | PRN
  Administered 2018-12-13: 10 mL

## 2018-12-13 MED ORDER — VANCOMYCIN HCL 1000 MG IV SOLR
INTRAVENOUS | Status: DC | PRN
  Administered 2018-12-13: 1000 mg via INTRAVENOUS

## 2018-12-13 MED ORDER — VANCOMYCIN HCL 500 MG IV SOLR
500.0000 mg | Freq: Once | INTRAVENOUS | Status: AC
  Filled 2018-12-13: qty 500

## 2018-12-13 MED ORDER — CEFAZOLIN SODIUM-DEXTROSE 2-4 GM/100ML-% IV SOLN
2.0000 g | Freq: Three times a day (TID) | INTRAVENOUS | Status: DC
  Filled 2018-12-13 (×2): qty 100

## 2018-12-13 MED ORDER — LIDOCAINE 2% (20 MG/ML) 5 ML SYRINGE
INTRAMUSCULAR | Status: DC | PRN
  Administered 2018-12-13: 60 mg via INTRAVENOUS

## 2018-12-13 SURGICAL SUPPLY — 57 items
BAG DECANTER FOR FLEXI CONT (MISCELLANEOUS) ×3 IMPLANT
BENZOIN TINCTURE PRP APPL 2/3 (GAUZE/BANDAGES/DRESSINGS) ×3 IMPLANT
BLADE CLIPPER SURG (BLADE) IMPLANT
BLADE SURG 11 STRL SS (BLADE) ×3 IMPLANT
BUR CUTTER 7.0 ROUND (BURR) ×3 IMPLANT
BUR MATCHSTICK NEURO 3.0 LAGG (BURR) ×3 IMPLANT
CANISTER SUCT 3000ML PPV (MISCELLANEOUS) ×3 IMPLANT
CARTRIDGE OIL MAESTRO DRILL (MISCELLANEOUS) ×1 IMPLANT
CLOSURE WOUND 1/2 X4 (GAUZE/BANDAGES/DRESSINGS) ×1
CONT SPECI 4OZ STER CLIK (MISCELLANEOUS) ×3 IMPLANT
COVER WAND RF STERILE (DRAPES) ×3 IMPLANT
DECANTER SPIKE VIAL GLASS SM (MISCELLANEOUS) ×3 IMPLANT
DERMABOND ADVANCED (GAUZE/BANDAGES/DRESSINGS) ×2
DERMABOND ADVANCED .7 DNX12 (GAUZE/BANDAGES/DRESSINGS) ×1 IMPLANT
DIFFUSER DRILL AIR PNEUMATIC (MISCELLANEOUS) ×3 IMPLANT
DRAPE HALF SHEET 40X57 (DRAPES) IMPLANT
DRAPE LAPAROTOMY 100X72X124 (DRAPES) ×3 IMPLANT
DRAPE MICROSCOPE LEICA (MISCELLANEOUS) ×3 IMPLANT
DRAPE SURG 17X23 STRL (DRAPES) ×3 IMPLANT
DRSG OPSITE POSTOP 4X6 (GAUZE/BANDAGES/DRESSINGS) ×3 IMPLANT
DURAPREP 26ML APPLICATOR (WOUND CARE) ×3 IMPLANT
ELECT REM PT RETURN 9FT ADLT (ELECTROSURGICAL) ×3
ELECTRODE REM PT RTRN 9FT ADLT (ELECTROSURGICAL) ×1 IMPLANT
EVACUATOR 1/8 PVC DRAIN (DRAIN) ×3 IMPLANT
GAUZE 4X4 16PLY RFD (DISPOSABLE) IMPLANT
GAUZE SPONGE 4X4 12PLY STRL (GAUZE/BANDAGES/DRESSINGS) ×3 IMPLANT
GLOVE BIO SURGEON STRL SZ7 (GLOVE) IMPLANT
GLOVE BIO SURGEON STRL SZ8 (GLOVE) ×3 IMPLANT
GLOVE BIOGEL PI IND STRL 7.0 (GLOVE) ×1 IMPLANT
GLOVE BIOGEL PI INDICATOR 7.0 (GLOVE) ×2
GLOVE EXAM NITRILE XL STR (GLOVE) IMPLANT
GLOVE INDICATOR 8.5 STRL (GLOVE) ×3 IMPLANT
GOWN STRL REUS W/ TWL LRG LVL3 (GOWN DISPOSABLE) ×1 IMPLANT
GOWN STRL REUS W/ TWL XL LVL3 (GOWN DISPOSABLE) ×2 IMPLANT
GOWN STRL REUS W/TWL 2XL LVL3 (GOWN DISPOSABLE) IMPLANT
GOWN STRL REUS W/TWL LRG LVL3 (GOWN DISPOSABLE) ×2
GOWN STRL REUS W/TWL XL LVL3 (GOWN DISPOSABLE) ×4
HEMOSTAT POWDER SURGIFOAM 1G (HEMOSTASIS) ×3 IMPLANT
KIT BASIN OR (CUSTOM PROCEDURE TRAY) ×3 IMPLANT
KIT TURNOVER KIT B (KITS) ×3 IMPLANT
NEEDLE HYPO 22GX1.5 SAFETY (NEEDLE) ×3 IMPLANT
NEEDLE SPNL 22GX3.5 QUINCKE BK (NEEDLE) ×3 IMPLANT
NS IRRIG 1000ML POUR BTL (IV SOLUTION) ×3 IMPLANT
OIL CARTRIDGE MAESTRO DRILL (MISCELLANEOUS) ×3
PACK LAMINECTOMY NEURO (CUSTOM PROCEDURE TRAY) ×3 IMPLANT
RUBBERBAND STERILE (MISCELLANEOUS) ×6 IMPLANT
SPONGE SURGIFOAM ABS GEL SZ50 (HEMOSTASIS) ×3 IMPLANT
STRIP CLOSURE SKIN 1/2X4 (GAUZE/BANDAGES/DRESSINGS) ×2 IMPLANT
SUT VIC AB 0 CT1 18XCR BRD8 (SUTURE) ×1 IMPLANT
SUT VIC AB 0 CT1 8-18 (SUTURE) ×2
SUT VIC AB 2-0 CT1 18 (SUTURE) ×3 IMPLANT
SUT VICRYL 4-0 PS2 18IN ABS (SUTURE) ×3 IMPLANT
SWAB CULTURE LIQ STUART DBL (MISCELLANEOUS) ×3 IMPLANT
SWAB CULTURE LIQUID MINI MALE (MISCELLANEOUS) ×3 IMPLANT
TOWEL GREEN STERILE (TOWEL DISPOSABLE) ×3 IMPLANT
TOWEL GREEN STERILE FF (TOWEL DISPOSABLE) ×3 IMPLANT
WATER STERILE IRR 1000ML POUR (IV SOLUTION) ×3 IMPLANT

## 2018-12-13 NOTE — Consult Note (Signed)
[name] for Infectious Disease       Reason for Consult: lumbar epidural abscess and discitis    Referring Physician: Kary Kos, MD  Active Problems:   Osteomyelitis [facility])   Diskitis    Chlorhexidine Gluconate Cloth  6 each Topical Q0600   cyclobenzaprine  10 mg Oral TID   feeding supplement (ENSURE ENLIVE)  237 mL Oral BID BM   methylPREDNISolone  4 mg Oral Daily   mupirocin ointment  1 application Nasal BID   nicotine  21 mg Transdermal Daily   pantoprazole (PROTONIX) IV  40 mg Intravenous QHS   sodium chloride flush  3 mL Intravenous Q12H   sodium chloride flush  3 mL Intravenous Q12H   traMADol  50 mg Oral TID   [START ON 12/14/2018] vitamin C   Oral QPC breakfast    Recommendations: 1. Epidural abscess/discitis -as the patient did receive recent epidurals spinal injections, this does place him at risk for more atypical pathogen such as gram-negative's in addition to the typical staph and strep pathogen commonly associated with postoperative wounds.  Patient did have full healing after his initial microdiscectomy and denies any drainage chronically from his surgical site last summer.  Will initiate vancomycin and Rocephin while operative cultures are in process.  We will also check a CRP to establish a baseline of inflammation.  I anticipate an 8-week course of likely IV antibiotics for treatment medically of his condition.  Given the wedge deformity noted on his recent MRI, the patient may benefit from a back brace while receiving ongoing medical therapy.  Will defer this issue to his neurosurgeons.  2. Leukocytosis -this is likely reactive from the patient's newly discovered epidural abscess and vertebral discitis.  Will initiate empiric IV antibiotics as noted above.  Would repeat the patient's CBC with differential daily until the patient's white blood cell count has returned to within normal limits consistently.  3. Tobacco abuse -briefly discussed the  relationship between ongoing nicotine use and vasoconstriction which often leads to poor wound healing and increased risk for subsequent infection.  Patient's initial wound from his surgery last summer however did heal completely.  I have encouraged the patient to consider smoking cessation in total perhaps with medication adjunctive assistance if needed in order to reduce his risk for ongoing infection.  Assessment: The patient is a 58 y/o WM smoker with h/o lumbar microdiscectomy in 8/19 for radiculopathy who recently was receiving epidural steroid injections x 2 now admitted with an epidural abscess and L4-L5 discitis with possible osteomyelitis.  Antibiotics: Vancomycin, day 1 Rocephin, day 1  HPI: Thomas Simpson is a 58 y.o. male smoker who underwent a lumbar laminectomy and microdiscectomy in August 2019 who presented for an epidural abscess and L4-L5 discitis and possible osteomyelitis.  The patient complained of radicular symptoms prior to his surgery last year which initially improved.  However, despite no inciting direct or blunt trauma to his spine, he developed recurrent back pain at the very end of February 2020.  Approximately 1 week later he had an epidural steroid injection to his spine with minimal improvement.  He received a second epidural steroid injection to the same area approximately 2 weeks prior to admission.  Following this second steroid injection patient does admit to "muscular cramps" to his spine and a general feeling of malaise and "not feeling well".  Patient is unable to answer whether he had fevers and chills at home but does deny receiving any antibiotics prior  to admission.  An outpatient MRI did show a lumbar epidural abscess L4-L5 discitis and possible osteomyelitis as he had a wedge deformity in his L4 vertebra.  Patient underwent an I&D today for evacuation of his epidural abscess and placement of a lumbar drain.  Upon my request, the patient was started on  vancomycin and Rocephin postoperatively while cultures are being processed.  The patient's white blood cell count was elevated to 16,500.  Of note, the patient does admit that he continues to smoke 1 pack of cigarettes per day.  He also has been out of work since the end of February due to his ongoing spinal issues.  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 Fever curve, labs, cxs, and imaging all independently reviewed  Review of Systems: +lumbar back pain for ~6 weeks, occasional paresthesias to his LEs during this time. ?F/C, No N/V/diarrhea, SOB, or HA. All other systems reviewed and are negative    Past Medical History:  Diagnosis Date   Abscess 11/2018   LUMBAR   Asthma    Epidural abscess    L3-4    Social History   Tobacco Use   Smoking status: Current Every Day Smoker    Packs/day: 1.00    Years: 30.00    Pack years: 30.00    Types: Cigarettes   Smokeless tobacco: Never Used  Substance Use Topics   Alcohol use: Never    Frequency: Never   Drug use: Never    History reviewed. No pertinent family history.  No Known Allergies  Physical Exam: Vitals:   12/13/18 1556 12/13/18 1612  BP: (!) 137/91 (!) 141/90  Pulse: 78 76  Resp: 15 14  Temp:  (!) 97.5 F (36.4 C)  SpO2: 95% 95%  Physical Exam Gen: pleasant, moderate distress secondary to low back pain, A&Ox 3 Head: NCAT, no temporal wasting evident EENT: PERRL, EOMI, MMM, adequate dentition Neck: supple, no JVD CV: NRRR, no murmurs evident Pulm: CTA bilaterally, mild wheeze noted, no retractions Abd: soft, NTND, +BS Extrems:  No LE edema, 2+ pulses MSK: lumbar incision c/d/i with hemovac drain present Skin: no rashes, adequate skin turgor Neuro: CN II-XII grossly intact, no focal neurologic deficits appreciated, gait was not assessed, A&Ox 3  Lab Results  Component Value Date   WBC 16.5 (H) 12/12/2018   HGB 15.6 12/12/2018   HCT 45.5 12/12/2018   MCV 86.3 12/12/2018   PLT 406 (H)  12/12/2018    Lab Results  Component Value Date   CREATININE 0.74 12/12/2018   BUN 12 12/12/2018   NA 139 12/12/2018   K 3.8 12/12/2018   CL 101 12/12/2018   CO2 23 12/12/2018   No results found for: ALT, AST, GGT, ALKPHOS   Microbiology: Recent Results (from the past 240 hour(s))  Culture, blood (routine x 2)     Status: None (Preliminary result)   Collection Time: 12/12/18  6:48 PM  Result Value Ref Range Status   Specimen Description BLOOD LEFT HAND  Final   Special Requests   Final    BOTTLES DRAWN AEROBIC ONLY Blood Culture adequate volume   Culture   Final    NO GROWTH < 24 HOURS Performed at [facility], [address], [place],  [address]    Report Status PENDING  Incomplete  Culture, blood (routine x 2)     Status: None (Preliminary result)   Collection Time: 12/12/18  7:01 PM  Result Value Ref Range Status  Specimen Description BLOOD RIGHT HAND  Final   Special Requests   Final    BOTTLES DRAWN AEROBIC ONLY Blood Culture adequate volume   Culture   Final    NO GROWTH < 24 HOURS Performed at [facility], [address], [place], [place] [address]    Report Status PENDING  Incomplete  Surgical pcr screen     Status: Abnormal   Collection Time: 12/13/18 12:34 AM  Result Value Ref Range Status   MRSA, PCR POSITIVE (A) NEGATIVE Final    Comment: RESULT CALLED TO, READ BACK BY AND VERIFIED WITH: BELANGER,T RN 12/13/2018 AT 0236 SKEEN,P    Staphylococcus aureus POSITIVE (A) NEGATIVE Final    Comment: (NOTE) The Xpert SA Assay (FDA approved for NASAL specimens in patients 62 years of age and older), is one component of a comprehensive surveillance program. It is not intended to diagnose infection nor to guide or monitor treatment. Performed at [facility], [place] [address], [place], [place] [address]   Aerobic/Anaerobic Culture (surgical/deep wound)     Status: None (Preliminary result)   Collection Time: 12/13/18  2:13 PM  Result  Value Ref Range Status   Specimen Description WOUND LUMBAR 3/4 EPIDURAL FLUID  Final   Special Requests NONE  Final   Gram Stain   Final    RARE WBC PRESENT,BOTH PMN AND MONONUCLEAR NO ORGANISMS SEEN Performed at [facility], [place] [address], [place], [place] [address]    Culture PENDING  Incomplete   Report Status PENDING  Incomplete  Aerobic/Anaerobic Culture (surgical/deep wound)     Status: None (Preliminary result)   Collection Time: 12/13/18  2:34 PM  Result Value Ref Range Status   Specimen Description TISSUE  Final   Special Requests L3 L4 DISC SPACE  Final   Gram Stain   Final    ABUNDANT WBC PRESENT,BOTH PMN AND MONONUCLEAR NO ORGANISMS SEEN Performed at [facility], [address], [place], [place] [address]    Culture PENDING  Incomplete   Report Status PENDING  Incomplete    Janine Ores, MD [facility] Infectious [email or web address] 12/13/2018, 5:22 PM

## 2018-12-13 NOTE — Progress Notes (Signed)
OT Cancellation Note  Patient Details Name: MERTON WADLOW MRN: [id number] DOB: 1961-02-07   Cancelled Treatment:    Reason Eval/Treat Not Completed: Patient at procedure or test/ unavailable. Pt in OR. Will return for OT evaluation tomorrow pending pt readiness for therapy.    Darrol Jump  OTR/L Acute Rehabilitation Services 12/13/2018, 1:11 PM

## 2018-12-13 NOTE — Progress Notes (Signed)
Initial Nutrition Assessment   RD working remotely.  DOCUMENTATION CODES:   Not applicable  INTERVENTION:  Once diet advances, provide Ensure Enlive po BID, each supplement provides 350 kcal and 20 grams of protein.  NUTRITION DIAGNOSIS:   Increased nutrient needs related to post-op healing as evidenced by estimated needs.  GOAL:   Patient will meet greater than or equal to 90% of their needs  MONITOR:   Supplement acceptance, Labs, Weight trends, I & O's, Skin, Diet advancement  REASON FOR ASSESSMENT:   Malnutrition Screening Tool    ASSESSMENT:   58 y/o male presents with secondary ongoing back pain. MRI revealed severe discitis osteomyelitis with a component causing an epidural abscess and compression of the left L4 nerve root.  Pt currently in OR. RD unable to obtain pt nutrition history. Nursing staff to provide nutritional supplements once diet advances for post op healing and adequate nutrition.    Unable to complete Nutrition-Focused physical exam at this time.   Labs and medications reviewed.   Diet Order:   Diet Order            Diet NPO time specified  Diet effective midnight              EDUCATION NEEDS:   Not appropriate for education at this time  Skin:  Skin Assessment: Reviewed RN Assessment  Last BM:  4/18  Height:   Ht Readings from Last 1 Encounters:  12/13/18 5\' 10"  (1.778 m)    Weight:   Wt Readings from Last 1 Encounters:  12/13/18 79.2 kg    Ideal Body Weight:  75.45 kg  BMI:  Body mass index is 25.05 kg/m.  Estimated Nutritional Needs:   Kcal:  2000-2200  Protein:  100-110 grams  Fluid:  2- 2.2 L/day    Corrin Parker, MS, RD, LDN Pager # [phone] After hours/ weekend pager # [phone]

## 2018-12-13 NOTE — Transfer of Care (Signed)
Immediate Anesthesia Transfer of Care Note  Patient: Thomas Simpson  Procedure(s) Performed: Left Lumbar three-four Laminectomy for epidural abscess (Left Back)  Patient Location: PACU  Anesthesia Type:General  Level of Consciousness: drowsy and patient cooperative  Airway & Oxygen Therapy: Patient Spontanous Breathing and Patient connected to face mask oxygen  Post-op Assessment: Report given to RN and Post -op Vital signs reviewed and stable  Post vital signs: Reviewed and stable  Last Vitals:  Vitals Value Taken Time  BP 118/73 12/13/2018  3:27 PM  Temp    Pulse 84 12/13/2018  3:31 PM  Resp 15 12/13/2018  3:31 PM  SpO2 100 % 12/13/2018  3:31 PM  Vitals shown include unvalidated device data.  Last Pain:  Vitals:   12/13/18 1245  TempSrc:   PainSc: 0-No pain      Patients Stated Pain Goal: 4 (46/00/29 8473)  Complications: No apparent anesthesia complications

## 2018-12-13 NOTE — Anesthesia Procedure Notes (Signed)
Procedure Name: Intubation Date/Time: 12/13/2018 1:56 PM Performed by: Kathryne Hitch, CRNA Pre-anesthesia Checklist: Patient identified, Emergency Drugs available, Suction available, Patient being monitored and Timeout performed Patient Re-evaluated:Patient Re-evaluated prior to induction Oxygen Delivery Method: Circle system utilized Preoxygenation: Pre-oxygenation with 100% oxygen Induction Type: IV induction, Rapid sequence and Cricoid Pressure applied Laryngoscope Size: Miller and 2 Grade View: Grade I Tube type: Oral Tube size: 7.5 mm Number of attempts: 1 Airway Equipment and Method: Stylet Placement Confirmation: ETT inserted through vocal cords under direct vision,  positive ETCO2 and breath sounds checked- equal and bilateral Secured at: 23 cm Tube secured with: Tape Dental Injury: Teeth and Oropharynx as per pre-operative assessment

## 2018-12-13 NOTE — Op Note (Signed)
Preoperative diagnosis: Osteomyelitis L3-4 discitis epidural abscess L3-4  Postoperative diagnosis: Same  Procedure: Decompressive lumbar laminectomy L3-4 and extending up to the inferior aspect of the 2 3 to space with foraminotomies of the L3 and L4 nerve roots on the left and microscopic discectomy with microscopic dissection of the L3 and L4 nerve roots  Surgeon: Dominica Severin Caellum Mancil  Assistant: Nash Shearer  Anesthesia: General  EBL: Minimal  HPI: Patient is a very pleasant 58 year old gentleman who is a progressive worsening back left hip and leg pain work-up revealed severe enhancement of the L3-L4 vertebral bodies epidural enhancement in the space enhancement consistent with epidural abscess discitis and osteomyelitis.  Patient was brought into the hospital was admitted blood cultures obtained work-up initiated and taken to the OR for decompressive laminectomy cultures and discectomy.  I extensively went over the risks and benefits of this procedure with him as well as perioperative course expectations of outcome and alternatives to surgery and he understood and agreed to proceed forward.  Operative procedure: Patient brought into the OR was used in general anesthesia positioned prone on the Wilson frame his back was prepped and draped in routine sterile fashion.  Preoperative x-ray localized the appropriate level so after infiltration of 10 cc lidocaine with epi midline incision was made and Bovie electrocautery was used to take down subtenons tissue and subperiosteal dissection was carried lamina of L3 up to the inferior aspect of the 2 lamina and the superior aspect of the L4 lamina.  Intraoperative x-ray confirmed the location of 3 4 the space so the entire lamina of L3 was drilled down on the left side and the medial facet was also drilled down as well as the inferior aspect of 2 and superior aspect of 4.  Laminotomy was begun with a 3 and forming a Kerrison punch.  Ligament flow was identified  and removed in piecemeal fashion.  Undersurface of the medial gutter was under bit with a 2 and 3 Miller Kerrison punch.  The operative microscope was draped and brought into the field and under my supplementation further under biting of the medial gutter allowed defecation of both the L3 and L4 pedicles.  I then identified the L3 nerve root and directly visualized this in the L4 nerve root.  Working between the 2 the dura was densely adherent to inflammatory membrane I made an incision the lateral aspect of the space in a safe zone working under this inflammatory membrane and removed several liquefied fragments of disc and sent for culture.  In addition I took some culture swabs of the epidural space just superior to the disc space.  During the discectomy remove several large fragments of liquefied disc there was a mild to moderate amount of venous ooze coming from to the space this was packed with Gelfoam and eventually stop with serial packing and irrigation.  Decompress the thecal sac and decompress both L3 and L4 nerve roots on the left did not appreciate any more free fluid or disc fragments causing stenosis.  The wound was then copiously irrigated meticulous hemostasis was maintained antibiotics were initiated after all cultures were obtained.  And then Gelfoam was awake top of the dura medium Hemovac drain was placed and the wound was closed in layers with interrupted Vicryl and a running 4 subcuticular Dermabond benzoin Steri-Strips and a sterile dressing was applied patient recovery in stable condition.  At the end the case all needle, sponge counts were correct.

## 2018-12-13 NOTE — Progress Notes (Signed)
Subjective: Patient reports Overall patient symptoms unchanged back predominantly left hip and leg pain bilateral groin pain  Objective: Vital signs in last 24 hours: Temp:  [97.9 F (36.6 C)-98.3 F (36.8 C)] 98 F (36.7 C) (04/22 0932) Pulse Rate:  [93-109] 97 (04/22 0932) Resp:  [16-17] 17 (04/22 0932) BP: (130-159)/(87-96) 130/89 (04/22 0932) SpO2:  [93 %-99 %] 95 % (04/22 0932) Weight:  [79.2 kg-81 kg] 79.2 kg (04/22 1245)  Intake/Output from previous day: 04/21 0701 - 04/22 0700 In: 240 [P.O.:240] Out: 550 [Urine:550] Intake/Output this shift: Total I/O In: -  Out: 200 [Urine:200]  Strength 5 out of 5 iliopsoas, quads, hamstrings, gastroc, into tibialis, and EHL.  Some pain limitation proximal left lower extremity strength  Lab Results: Recent Labs    12/12/18 1842  WBC 16.5*  HGB 15.6  HCT 45.5  PLT 406*   BMET Recent Labs    12/12/18 1842  NA 139  K 3.8  CL 101  CO2 23  GLUCOSE 155*  BUN 12  CREATININE 0.74  CALCIUM 9.7    Studies/Results: Mr Lumbar Spine W Wo Contrast  Result Date: 12/11/2018 CLINICAL DATA:  Severe low back pain radiating down the left leg for the past 2 months. Prior surgery in August 2019. EXAM: MRI LUMBAR SPINE WITHOUT AND WITH CONTRAST TECHNIQUE: Multiplanar and multiecho pulse sequences of the lumbar spine were obtained without and with intravenous contrast. CONTRAST:  44mL MULTIHANCE GADOBENATE DIMEGLUMINE 529 MG/ML IV SOLN COMPARISON:  MR lumbar spine dated September 05, 2018. FINDINGS: Segmentation:  Standard. Alignment: Unchanged mild dextroscoliosis. Sagittal alignment is maintained. Vertebrae: New osteomyelitis discitis at L3-L4 with fluid and enhancement of the disc space, erosion of the endplates, and confluent marrow edema with decreased T1 marrow signal throughout both L3 and L4 vertebral bodies. Mild right-sided endplate marrow edema and enhancement at L4-L5 is unchanged likely degenerative. No fracture or suspicious bone  lesion. Conus medullaris and cauda equina: Conus extends to the L1 level. Conus and cauda equina appear normal. No intradural enhancement. Paraspinal and other soft tissues: Prominent paravertebral inflammatory changes at L3-L4 extending into both psoas muscles with tiny 5 mm rim enhancing fluid collection in the left psoas muscle. There is a larger incompletely visualized 1.6 cm abscess in the inferior left psoas muscle at the level of the sacral ala. Prominent anterior epidural phlegmon extending from the superior L3 endplate to the inferior L4 endplate with small 6 x 3 x 8 mm epidural abscess on the left behind the L3 vertebral body. Disc levels: T12-L1:  Negative. L1-L2:  Negative. L2-L3: Negative disc. New severe left lateral recess stenosis due to epidural phlegmon. No spinal canal or neuroforaminal stenosis. L3-L4: New severe spinal canal and bilateral lateral recess stenosis due to disc bulging and anterior epidural phlegmon. Progressive moderate to severe left and severe right neuroforaminal stenosis. L4-L5: Prior right hemilaminectomy. Slightly decreased enhancing granulation tissue along the right epidural space extending into the right subarticular zone inferiorly, adjacent to the descending right L5 nerve root as it exits the thecal sac. There is a new shallow left subarticular and foraminal disc protrusion. Unchanged mild bilateral facet arthropathy. New mild to moderate left lateral recess stenosis. Unchanged moderate right and mild-to-moderate left neuroforaminal stenosis. Resolved spinal canal stenosis. L5-S1: Unchanged shallow broad-based posterior disc protrusion with annular fissure. No stenosis. IMPRESSION: 1. New osteomyelitis discitis at L3-L4 with prominent anterior epidural phlegmon and small 6 x 3 x 8 mm epidural abscess resulting in severe spinal canal and lateral recess  stenosis with progressive moderate to severe left and severe right neuroforaminal stenosis. Additional new severe left  lateral recess stenosis at L2-L3 due to epidural phlegmon. 2. Prominent paravertebral inflammatory changes at L3-L4 with tiny 5 mm abscess in the left psoas muscle at this level and larger, incompletely visualized 1.6 cm abscess in the inferior left psoas muscle at the level of the sacral ala. 3. Decreasing granulation tissue in the surgical bed and right subarticular zone at L4-L5. New shallow left sided disc protrusion at this level with new mild to moderate left lateral recess stenosis. Unchanged moderate right and mild-to-moderate left neuroforaminal stenosis. These results will be called to the ordering clinician or representative by the Radiologist Assistant, and communication documented in the PACS or zVision Dashboard. Electronically Signed   By: Titus Dubin M.D.   On: 12/11/2018 16:44    Assessment/Plan: Hospital day 1 for work-up and evaluation of osteomyelitis discitis epidural abscess L3-4 left.  Patient currently is preop for a left L3-4 laminectomy for evacuation of epidural abscess and decompression.  I have extensively gone over the risks and benefits of the operation with him as well as perioperative course expectations of outcome and alternatives of surgery he understands agrees to proceed forward.  We have not given him any preoperative antibiotics we will dose him once we obtain cultures.  LOS: 1 day     Tiffanny Lamarche P 12/13/2018, 1:24 PM

## 2018-12-13 NOTE — Anesthesia Preprocedure Evaluation (Signed)
Anesthesia Evaluation  Patient identified by MRN, date of birth, ID band Patient awake    Reviewed: Allergy & Precautions, H&P , NPO status , Patient's Chart, lab work & pertinent test results  Airway Mallampati: II   Neck ROM: full    Dental   Pulmonary asthma , Current Smoker,    breath sounds clear to auscultation       Cardiovascular negative cardio ROS   Rhythm:regular Rate:Normal     Neuro/Psych    GI/Hepatic   Endo/Other    Renal/GU      Musculoskeletal   Abdominal   Peds  Hematology   Anesthesia Other Findings   Reproductive/Obstetrics                             Anesthesia Physical Anesthesia Plan  ASA: II  Anesthesia Plan: General   Post-op Pain Management:    Induction: Intravenous  PONV Risk Score and Plan: 1 and Ondansetron, Dexamethasone, Midazolam and Treatment may vary due to age or medical condition  Airway Management Planned: Oral ETT  Additional Equipment:   Intra-op Plan:   Post-operative Plan: Extubation in OR  Informed Consent: I have reviewed the patients History and Physical, chart, labs and discussed the procedure including the risks, benefits and alternatives for the proposed anesthesia with the patient or authorized representative who has indicated his/her understanding and acceptance.       Plan Discussed with: CRNA, Anesthesiologist and Surgeon  Anesthesia Plan Comments:         Anesthesia Quick Evaluation

## 2018-12-13 NOTE — Progress Notes (Signed)
Pharmacy Antibiotic Note  Thomas Simpson is a 58 y.o. male admitted on 12/12/2018 with MRI that looks like discitis osteomyelitis and epidural abscess (noted with recent laminectomy for evacuation of epidural abscess and decompression) .  Pharmacy has been consulted for vancomycin dosing. -WBC= 16.5, afebrile, SCr= 0.74 -Vancomycin 1000mg  IV given at 2:30pm today  Plan: -500mg  IV vancomycin (for a total load of 1500mg ) followed by 1250mg  IV q12h (calculated AUC=  473) -Will follow renal function, cultures and clinical progress    Height: 5\' 10"  (177.8 cm) Weight: 174 lb 9.7 oz (79.2 kg) IBW/kg (Calculated) : 73  Temp (24hrs), Avg:97.9 F (36.6 C), Min:97.5 F (36.4 C), Max:98.3 F (36.8 C)  Recent Labs  Lab 12/12/18 1842  WBC 16.5*  CREATININE 0.74    Estimated Creatinine Clearance: 106.5 mL/min (by C-G formula based on SCr of 0.74 mg/dL).    No Known Allergies  Antimicrobials this admission: 4/22 vanc  Dose adjustments this admission:   Microbiology results: 4/22 lumbar wound 4/21 blood x2- ngtd  Thank you for allowing pharmacy to be a part of this patient's care.  Hildred Laser, PharmD Clinical Pharmacist **Pharmacist phone directory can now be found on [email or web address] ([email or web address]).  Listed under [place].

## 2018-12-13 NOTE — Progress Notes (Signed)
Called [facility] answering service to request Nicotene patch. MD called back with an order for Nicoderm 21, started as ordered. CHG given last pm. This am received call from lab with +MRSA/staph result from preop nasal swab, called to on call at this time.

## 2018-12-13 NOTE — Evaluation (Signed)
Physical Therapy Evaluation Patient Details Name: Thomas Simpson MRN: [id number] DOB: July 26, 1961 Today's Date: 12/13/2018   History of Present Illness  Pt is a 58 y/o male admitted secondary ongoing back pain. MRI revealed severe discitis osteomyelitis with a component causing an epidural abscess and compression of the left L4 nerve root. PMH including but not limited to right-sided L4-5 laminectomy microdiscectomy 03/24/18.    Clinical Impression  Pt presented supine in bed with HOB elevated, awake and willing to participate in therapy session. Prior to admission, pt reported that he was ambulating with use of RW and required physical assistance from wife and daughter for bed mobility and transfers. Pt also required assistance from his wife for bathing/dressing. Pt currently very limited secondary to pain and weakness. Pt with muscle spasms with bed mobility and declining further movement at time of evaluation. Plan is for pt to go to the OR today for further diagnosis and laminectomy decompression per neurosurgery. Pt would continue to benefit from skilled physical therapy services at this time while admitted and after d/c to address the below listed limitations in order to improve overall safety and independence with functional mobility.    Follow Up Recommendations SNF;Other (comment)(pt declining SNF; will need HHPT and 24/7 physical assist)    Equipment Recommendations  3in1 (PT)    Recommendations for Other Services       Precautions / Restrictions Precautions Precautions: Fall;Back Precaution Comments: attempted to educate pt in log roll technique for bed mobility; however, pt reported that when he rolls his back spasms and he is unable to perform Required Braces or Orthoses: ("no brace needed" MD order) Restrictions Weight Bearing Restrictions: No      Mobility  Bed Mobility Overal bed mobility: Needs Assistance Bed Mobility: Rolling Rolling: Min guard         General  bed mobility comments: increased time and effort, use of bed rails with UE; attempted to sit EOB but pt with reports of spasming in back and declining further mobility at this time  Transfers                    Ambulation/Gait                Stairs            Wheelchair Mobility    Modified Rankin (Stroke Patients Only)       Balance                                             Pertinent Vitals/Pain Pain Assessment: Faces Faces Pain Scale: Hurts whole lot Pain Location: back Pain Descriptors / Indicators: Sore;Spasm Pain Intervention(s): Monitored during session;Repositioned    Home Living Family/patient expects to be discharged to:: Private residence Living Arrangements: Spouse/significant other Available Help at Discharge: Family;Available 24 hours/day Type of Home: House Home Access: Stairs to enter Entrance Stairs-Rails: Psychiatric nurse of Steps: 3 Home Layout: One level Home Equipment: Walker - 2 wheels;Cane - single point      Prior Function Level of Independence: Needs assistance   Gait / Transfers Assistance Needed: ambulates very short distances within his home with RW; requires assistance from wife and daughter for bed mobility and transferring into standing  ADL's / Homemaking Assistance Needed: requires assistance from spouse for bathing/dressing        Hand Dominance  Dominant Hand: Left    Extremity/Trunk Assessment   Upper Extremity Assessment Upper Extremity Assessment: Defer to OT evaluation;Overall [facility] for tasks assessed    Lower Extremity Assessment Lower Extremity Assessment: Generalized weakness       Communication   Communication: No difficulties  Cognition Arousal/Alertness: Awake/alert Behavior During Therapy: WFL for tasks assessed/performed Overall Cognitive Status: Within Functional Limits for tasks assessed                                         General Comments      Exercises     Assessment/Plan    PT Assessment Patient needs continued PT services  PT Problem List Decreased strength;Decreased balance;Decreased mobility;Decreased coordination;Decreased activity tolerance;Decreased knowledge of use of DME;Decreased safety awareness;Decreased knowledge of precautions;Pain       PT Treatment Interventions DME instruction;Gait training;Stair training;Functional mobility training;Therapeutic activities;Therapeutic exercise;Balance training;Neuromuscular re-education;Patient/family education    PT Goals (Current goals can be found in the Care Plan section)  Acute Rehab PT Goals Patient Stated Goal: decrease pain PT Goal Formulation: With patient Time For Goal Achievement: 12/27/18 Potential to Achieve Goals: Good    Frequency Min 5X/week   Barriers to discharge        Co-evaluation               AM-PAC PT "6 Clicks" Mobility  Outcome Measure Help needed turning from your back to your side while in a flat bed without using bedrails?: A Little Help needed moving from lying on your back to sitting on the side of a flat bed without using bedrails?: A Lot Help needed moving to and from a bed to a chair (including a wheelchair)?: A Lot Help needed standing up from a chair using your arms (e.g., wheelchair or bedside chair)?: A Lot Help needed to walk in hospital room?: A Lot Help needed climbing 3-5 steps with a railing? : A Lot 6 Click Score: 13    End of Session   Activity Tolerance: Patient limited by pain Patient left: in bed;with call bell/phone within reach;with bed alarm set Nurse Communication: Mobility status PT Visit Diagnosis: Pain Pain - part of body: (back)    Time: 4562-5638 PT Time Calculation (min) (ACUTE ONLY): 22 min   Charges:   PT Evaluation $PT Eval Moderate Complexity: [place], PT, DPT  Acute Rehabilitation Services Pager [phone] Office  [place] 12/13/2018, 11:36 AM

## 2018-12-14 ENCOUNTER — Encounter (HOSPITAL_COMMUNITY): Payer: Self-pay | Admitting: Neurosurgery

## 2018-12-14 ENCOUNTER — Inpatient Hospital Stay: Payer: Self-pay

## 2018-12-14 DIAGNOSIS — Z720 Tobacco use: Secondary | ICD-10-CM

## 2018-12-14 DIAGNOSIS — Z95828 Presence of other vascular implants and grafts: Secondary | ICD-10-CM

## 2018-12-14 MED ORDER — PANTOPRAZOLE SODIUM 40 MG PO TBEC
40.0000 mg | DELAYED_RELEASE_TABLET | Freq: Every day | ORAL | Status: DC
  Administered 2018-12-14 – 2018-12-15 (×2): 40 mg via ORAL
  Filled 2018-12-14 (×2): qty 1

## 2018-12-14 MED ORDER — SODIUM CHLORIDE 0.9% FLUSH: 10.0000 mL | Freq: Two times a day (BID) | INTRAVENOUS | Status: DC

## 2018-12-14 MED ORDER — SODIUM CHLORIDE 0.9% FLUSH: 10.0000 mL | INTRAVENOUS | Status: DC | PRN

## 2018-12-14 MED ORDER — PANTOPRAZOLE SODIUM 40 MG PO TBEC: 40.0000 mg | DELAYED_RELEASE_TABLET | Freq: Every day | ORAL | Status: DC

## 2018-12-14 NOTE — Progress Notes (Signed)
Pt alert and oriented x4. Attempted to ambulate patient but he refused to get out of bed due to pain. Pain meds were given and patient refused second attempt to ambulate. SCDs placed on patient, he was educated on importance of ambulating. Pt stated will try again in the morning/

## 2018-12-14 NOTE — Evaluation (Signed)
Occupational Therapy Evaluation Patient Details Name: Thomas Simpson MRN: [id number] DOB: [birth year] Today's Date: 12/14/2018    History of Present Illness Pt is a 58 y/o male admitted secondary ongoing back pain. MRI revealed severe discitis osteomyelitis with a component causing an epidural abscess and compression of the left L4 nerve root. Underwent decompressive lumbar laminectomy and microdiscectomy L3-L4 on 12/13/18. PMH including but not limited to right-sided L4-5 laminectomy microdiscectomy 03/24/18.   Clinical Impression   Pt in significant pain,unable to sit without support of UEs. Pt educated in log roll technique for bed mobility, requires max assist to raise trunk. Pt transferred to chair with min assist and heavy reliance on RW. He reports increased pain with weight on L LE. Pt requires set up to total assist for ADL. Will follow acutely. Pt reports he has a supportive wife and daughter to assist him once he returns home.    Follow Up Recommendations  Home health OT;Supervision/Assistance - 24 hour    Equipment Recommendations  3 in 1 bedside commode    Recommendations for Other Services       Precautions / Restrictions Precautions Precautions: Fall;Back Precaution Booklet Issued: No Precaution Comments: educated in BLT Restrictions Weight Bearing Restrictions: No      Mobility Bed Mobility Overal bed mobility: Needs Assistance Bed Mobility: Rolling;Sidelying to Sit Rolling: Min guard Sidelying to sit: Max assist       General bed mobility comments: cues for log roll technique, increased time and effort, use of bed rail, assist for LEs over EOB and to raise trunk  Transfers Overall transfer level: Needs assistance Equipment used: Rolling walker (2 wheeled) Transfers: Sit to/from Stand Sit to Stand: Min assist;From elevated surface         General transfer comment: cues for hand placement, assist to rise and steady, pt with difficulty putting weight on L  LE due to pain, heavy reliance on RW    Balance Overall balance assessment: Needs assistance Sitting-balance support: Bilateral upper extremity supported;Feet unsupported Sitting balance-Leahy Scale: Fair Sitting balance - Comments: unable to sit without B UEs   Standing balance support: Bilateral upper extremity supported Standing balance-Leahy Scale: Poor                             ADL either performed or assessed with clinical judgement   ADL Overall ADL's : Needs assistance/impaired Eating/Feeding: Set up;Sitting   Grooming: Wash/dry hands;Wash/dry face;Sitting;Set up   Upper Body Bathing: Maximal assistance;Sitting   Lower Body Bathing: Total assistance;Sit to/from stand   Upper Body Dressing : Maximal assistance;Sitting   Lower Body Dressing: Total assistance;Sit to/from stand   Toilet Transfer: Minimal assistance;Stand-pivot;RW   Toileting- Clothing Manipulation and Hygiene: Total assistance;Sit to/from stand         General ADL Comments: pt unable to sit without use of B UE to guard due to pain     Vision Patient Visual Report: No change from baseline       Perception     Praxis      Pertinent Vitals/Pain Pain Assessment: Faces Faces Pain Scale: Hurts whole lot Pain Location: back Pain Descriptors / Indicators: Sore;Spasm Pain Intervention(s): Monitored during session;Premedicated before session;Ice applied;Repositioned     Hand Dominance Left   Extremity/Trunk Assessment Upper Extremity Assessment Upper Extremity Assessment: Overall WFL for tasks assessed(hx of L shoulder limitations)   Lower Extremity Assessment Lower Extremity Assessment: Defer to PT evaluation   Cervical / Trunk  Assessment Cervical / Trunk Assessment: Other exceptions Cervical / Trunk Exceptions: s/p surgery, drain in place   Communication Communication Communication: No difficulties   Cognition Arousal/Alertness: Awake/alert Behavior During Therapy: WFL  for tasks assessed/performed Overall Cognitive Status: Within Functional Limits for tasks assessed                                     General Comments       Exercises     Shoulder Instructions      Home Living Family/patient expects to be discharged to:: Private residence Living Arrangements: Spouse/significant other Available Help at Discharge: Family;Available 24 hours/day Type of Home: House Home Access: Stairs to enter [employer] of Steps: 3 Entrance Stairs-Rails: Right;Left Home Layout: One level     Bathroom Shower/Tub: Occupational psychologist: Standard     Home Equipment: Environmental consultant - 2 wheels;Cane - single point          Prior Functioning/Environment Level of Independence: Needs assistance  Gait / Transfers Assistance Needed: ambulates very short distances within his home with RW; requires assistance from wife and daughter for bed mobility and transferring into standing ADL's / Homemaking Assistance Needed: requires assistance from spouse for bathing/dressing            OT Problem List: Decreased strength;Decreased activity tolerance;Impaired balance (sitting and/or standing);Decreased knowledge of use of DME or AE;Pain      OT Treatment/Interventions: Self-care/ADL training;DME and/or AE instruction;Patient/family education;Therapeutic activities;Balance training    OT Goals(Current goals can be found in the care plan section) Acute Rehab OT Goals Patient Stated Goal: decrease pain OT Goal Formulation: With patient Time For Goal Achievement: 12/28/18 Potential to Achieve Goals: Good ADL Goals Pt Will Perform Grooming: with min guard assist;standing Pt Will Perform Lower Body Bathing: with supervision;with set-up;with adaptive equipment;sit to/from stand Pt Will Perform Lower Body Dressing: with supervision;sit to/from stand;with adaptive equipment Pt Will Transfer to Toilet: with supervision;ambulating;bedside  commode(over toilet) Pt Will Perform Toileting - Clothing Manipulation and hygiene: with supervision;sit to/from stand Additional ADL Goal #1: Pt will state 3/3 back precautions. Additional ADL Goal #2: Pt will perform bed mobility using log roll technique with supervision. Additional ADL Goal #3: Pt will sit EOB unsupported by UEs x 5 minutes in preparation for ADL.  OT Frequency: Min 3X/week   Barriers to D/C:            Co-evaluation              AM-PAC OT "6 Clicks" Daily Activity     Outcome Measure Help from another person eating meals?: A Little Help from another person taking care of personal grooming?: A Little Help from another person toileting, which includes using toliet, bedpan, or urinal?: Total Help from another person bathing (including washing, rinsing, drying)?: A Lot Help from another person to put on and taking off regular upper body clothing?: A Lot Help from another person to put on and taking off regular lower body clothing?: Total 6 Click Score: 12   End of Session Equipment Utilized During Treatment: Gait belt;Rolling walker  Activity Tolerance: Patient limited by pain Patient left: in chair;with call bell/phone within reach  OT Visit Diagnosis: Unsteadiness on feet (R26.81);Other abnormalities of gait and mobility (R26.89);Pain;Muscle weakness (generalized) (M62.81)                Time: 1000-1035 OT Time Calculation (min): 35 min Charges:  OT General Charges $OT Visit: 1 Visit OT Evaluation $OT Eval Moderate Complexity: 1 Mod OT Treatments $Self Care/Home Management : 8-22 mins  Nestor Lewandowsky, OTR/L Acute Rehabilitation Services Pager: [phone] Office: [phone]  Malka So 12/14/2018, 10:53 AM

## 2018-12-14 NOTE — Progress Notes (Signed)
Physical Therapy Treatment Patient Details Name: Thomas Simpson MRN: [id number] DOB: [birth year] Today's Date: 12/14/2018    History of Present Illness Pt is a 58 y/o male admitted secondary ongoing back pain. MRI revealed severe discitis osteomyelitis with a component causing an epidural abscess and compression of the left L4 nerve root. Underwent decompressive lumbar laminectomy and microdiscectomy L3-L4 on 12/13/18. PMH including but not limited to right-sided L4-5 laminectomy microdiscectomy 03/24/18.    PT Comments    Pt making slow progress with functional mobility but tolerated short distance ambulation in room with RW and min guard. Pt would continue to benefit from skilled physical therapy services at this time while admitted and after d/c to address the below listed limitations in order to improve overall safety and independence with functional mobility.    Follow Up Recommendations  Supervision/Assistance - 24 hour;SNF;Other (comment)(pt declining SNF; will need HHPT and 24/7 physical assist)     Equipment Recommendations  3in1 (PT)    Recommendations for Other Services       Precautions / Restrictions Precautions Precautions: Fall;Back Precaution Booklet Issued: No Precaution Comments: pt able to recall 2/3 back precautions Restrictions Weight Bearing Restrictions: No    Mobility  Bed Mobility Overal bed mobility: Needs Assistance Bed Mobility: Sit to Sidelying;Rolling Rolling: Min guard Sidelying to sit: Max assist     Sit to sidelying: Min assist General bed mobility comments: cueing for log roll technique, min A needed to return bilateral LEs onto bed  Transfers Overall transfer level: Needs assistance Equipment used: Rolling walker (2 wheeled) Transfers: Sit to/from Stand Sit to Stand: Min assist         General transfer comment: cues for hand placement, assist to rise and steady, pt with difficulty putting weight on L LE due to pain, heavy reliance on  RW  Ambulation/Gait Ambulation/Gait assistance: Min guard Gait Distance (Feet): 20 Feet Assistive device: Rolling walker (2 wheeled) Gait Pattern/deviations: Step-to pattern;Step-through pattern;Decreased step length - right;Decreased step length - left;Decreased stride length;Decreased weight shift to left;Antalgic Gait velocity: decreased   General Gait Details: pt with slow, guarded, antalgic gait pattern; very limited secondary to pain   Stairs             Wheelchair Mobility    Modified Rankin (Stroke Patients Only)       Balance Overall balance assessment: Needs assistance Sitting-balance support: Bilateral upper extremity supported;Feet unsupported Sitting balance-Leahy Scale: Fair Sitting balance - Comments: unable to sit without B UEs   Standing balance support: Bilateral upper extremity supported Standing balance-Leahy Scale: Poor                              Cognition Arousal/Alertness: Awake/alert Behavior During Therapy: WFL for tasks assessed/performed Overall Cognitive Status: Within Functional Limits for tasks assessed                                        Exercises      General Comments        Pertinent Vitals/Pain Pain Assessment: Faces Faces Pain Scale: Hurts whole lot Pain Location: back, L hip Pain Descriptors / Indicators: Sore;Spasm Pain Intervention(s): Monitored during session;Repositioned    Home Living Family/patient expects to be discharged to:: Private residence Living Arrangements: Spouse/significant other Available Help at Discharge: Family;Available 24 hours/day Type of Home: House Home Access: Stairs to  enter Entrance Stairs-Rails: Right;Left Home Layout: One level Home Equipment: [place] - 2 wheels;Cane - single point      Prior Function Level of Independence: Needs assistance  Gait / Transfers Assistance Needed: ambulates very short distances within his home with RW; requires  assistance from wife and daughter for bed mobility and transferring into standing ADL's / Homemaking Assistance Needed: requires assistance from spouse for bathing/dressing     PT Goals (current goals can now be found in the care plan section) Acute Rehab PT Goals Patient Stated Goal: decrease pain PT Goal Formulation: With patient Time For Goal Achievement: 12/27/18 Potential to Achieve Goals: Good Progress towards PT goals: Progressing toward goals    Frequency    Min 5X/week      PT Plan Current plan remains appropriate    Co-evaluation              AM-PAC PT "6 Clicks" Mobility   Outcome Measure  Help needed turning from your back to your side while in a flat bed without using bedrails?: A Little Help needed moving from lying on your back to sitting on the side of a flat bed without using bedrails?: A Lot Help needed moving to and from a bed to a chair (including a wheelchair)?: A Lot Help needed standing up from a chair using your arms (e.g., wheelchair or bedside chair)?: A Little Help needed to walk in hospital room?: A Little Help needed climbing 3-5 steps with a railing? : A Lot 6 Click Score: 15    End of Session   Activity Tolerance: Patient limited by pain Patient left: in bed;with call bell/phone within reach;with bed alarm set Nurse Communication: Mobility status PT Visit Diagnosis: Pain Pain - Right/Left: Left Pain - part of body: Hip(and back)     Time: 2863-8177 PT Time Calculation (min) (ACUTE ONLY): 26 min  Charges:  $Gait Training: 8-22 mins $Therapeutic Activity: 8-22 mins                     Sherie Don, PT, DPT  Acute Rehabilitation Services Pager [phone] Office [place] 12/14/2018, 1:20 PM

## 2018-12-14 NOTE — Plan of Care (Signed)
  Problem: Education: Goal: Knowledge of General Education information will improve Description Including pain rating scale, medication(s)/side effects and non-pharmacologic comfort measures Outcome: Progressing  Ambulated in room twice today and sat in recliner Continues to have pain 6/10 with scheduled tramadol/flexeril and prn oxycodone.

## 2018-12-14 NOTE — Progress Notes (Signed)
Peripherally Inserted Central Catheter/Midline Placement  The IV Nurse has discussed with the patient and/or persons authorized to consent for the patient, the purpose of this procedure and the potential benefits and risks involved with this procedure.  The benefits include less needle sticks, lab draws from the catheter, and the patient may be discharged home with the catheter. Risks include, but not limited to, infection, bleeding, blood clot (thrombus formation), and puncture of an artery; nerve damage and irregular heartbeat and possibility to perform a PICC exchange if needed/ordered by physician.  Alternatives to this procedure were also discussed.  Bard Power PICC patient education guide, fact sheet on infection prevention and patient information card has been provided to patient /or left at bedside.    PICC/Midline Placement Documentation  PICC Single Lumen 30/07/62 PICC Right Basilic 43 cm 0 cm (Active)  Indication for Insertion or Continuance of Line Home intravenous therapies (PICC only) 12/14/2018 12:21 PM  Exposed Catheter (cm) 0 cm 12/14/2018 12:21 PM  Site Assessment Clean;Dry;Intact 12/14/2018 12:21 PM  Line Status Flushed;Blood return noted;Saline locked 12/14/2018 12:21 PM  Dressing Type Transparent;Securing device 12/14/2018 12:21 PM  Dressing Status Clean;Dry;Intact;Antimicrobial disc in place 12/14/2018 12:21 PM  Dressing Change Due 12/21/18 12/14/2018 12:21 PM       Frances Maywood 12/14/2018, 12:24 PM

## 2018-12-14 NOTE — Progress Notes (Signed)
Subjective: Patient reports Patient doing well significant improvement in left leg radicular symptoms no fevers no headaches no nausea  Objective: Vital signs in last 24 hours: Temp:  [97.5 F (36.4 C)-98.3 F (36.8 C)] 97.8 F (36.6 C) (04/23 0435) Pulse Rate:  [76-103] 97 (04/23 0435) Resp:  [11-17] 16 (04/23 0435) BP: (118-141)/(73-91) 132/86 (04/23 0435) SpO2:  [95 %-100 %] 97 % (04/23 0435) Weight:  [79.2 kg] 79.2 kg (04/22 1245)  Intake/Output from previous day: 04/22 0701 - 04/23 0700 In: 1500 [I.V.:1400; IV Piggyback:100] Out: 1700 [Urine:1450; Drains:50; Blood:200] Intake/Output this shift: No intake/output data recorded.  Wound: Awake alert strength 5 out of 5 wound clean dry and intact drain output minimal at 50 cc over the last shift  Lab Results: Recent Labs    12/12/18 1842  WBC 16.5*  HGB 15.6  HCT 45.5  PLT 406*   BMET Recent Labs    12/12/18 1842  NA 139  K 3.8  CL 101  CO2 23  GLUCOSE 155*  BUN 12  CREATININE 0.74  CALCIUM 9.7    Studies/Results: Dg Lumbar Spine 2-3 Views  Result Date: 12/13/2018 CLINICAL DATA:  Epidural abscess EXAM: LUMBAR SPINE - 2-3 VIEW COMPARISON:  Lumbar MRI 12/11/2018 FINDINGS: Lateral localizing imaging of the lumbar spine. Needle is located between the spinous processes of L3 and L4. Normal alignment. IMPRESSION: Localization L3-4 spinous process. Electronically Signed   By: Franchot Gallo M.D.   On: 12/13/2018 15:16    Assessment/Plan: Postop day 1 decompressive laminectomy for presumptive discitis osteomyelitis and epidural abscess.  Gram stain shows white blood cells no bacteria identified yet patient is on Ancef and vancomycin.  We have consulted infectious disease we are awaiting their recommendations once we identify the bacteria.  Have ordered PICC line placement.  Recommend mobilization with physical and Occupational Therapy  LOS: 2 days     Thomas Simpson P 12/14/2018, 9:47 AM

## 2018-12-14 NOTE — Progress Notes (Signed)
Proceed with PICC placement. No need to wait for ID or blood culture results per Dr. Henrine Screws.

## 2018-12-14 NOTE — Progress Notes (Signed)
[facility] for Infectious Disease   Reason for visit: Follow up on lumbar epidural abscess/discitis  Antibiotics: Vancomycin, day 2 Rocephin, day 2    Interval History: Pt c/o low back pain but did ambulate some with PT within the room today. Appetite has been good. Dislikes hemovac drain. PICC placed by [facility] service today.  Fever curve, cx, and WBC trends all independently reviewed   Physical Exam: Vitals:   12/14/18 0435 12/14/18 0751  BP: 132/86 (!) 151/82  Pulse: 97 80  Resp: 16 17  Temp: 97.8 F (36.6 C) 97.9 F (36.6 C)  SpO2: 97% 95%  Gen: pleasant, moderate distress secondary to low back pain, A&Ox 3 Head: NCAT, no temporal wasting evident EENT: PERRL, EOMI, MMM, adequate dentition Neck: supple, no JVD CV: NRRR, no murmurs evident Pulm: CTA bilaterally, mild wheeze noted, no retractions Abd: soft, NTND, +BS Extrems:  No LE edema, 2+ pulses MSK: lumbar incision c/d/i with hemovac drain present Skin: no rashes, adequate skin turgor Neuro: CN II-XII grossly intact, no focal neurologic deficits appreciated, gait was not assessed, A&Ox 3  Review of Systems: +lumbar back pain for ~6 weeks, occasional paresthesias to his LEs during this time. ?F/C, No N/V/diarrhea, SOB, or HA. All other systems reviewed and are negative    Lab Results  Component Value Date   WBC 16.5 (H) 12/12/2018   HGB 15.6 12/12/2018   HCT 45.5 12/12/2018   MCV 86.3 12/12/2018   PLT 406 (H) 12/12/2018    Lab Results  Component Value Date   CREATININE 0.74 12/12/2018   BUN 12 12/12/2018   NA 139 12/12/2018   K 3.8 12/12/2018   CL 101 12/12/2018   CO2 23 12/12/2018   No results found for: ALT, AST, GGT, ALKPHOS   Microbiology: Recent Results (from the past 240 hour(s))  Culture, blood (routine x 2)     Status: None (Preliminary result)   Collection Time: 12/12/18  6:48 PM  Result Value Ref Range Status   Specimen Description BLOOD LEFT HAND  Final   Special Requests   Final   BOTTLES DRAWN AEROBIC ONLY Blood Culture adequate volume   Culture   Final    NO GROWTH < 24 HOURS Performed at [facility], [address], [place], [place] [address]    Report Status PENDING  Incomplete  Culture, blood (routine x 2)     Status: None (Preliminary result)   Collection Time: 12/12/18  7:01 PM  Result Value Ref Range Status   Specimen Description BLOOD RIGHT HAND  Final   Special Requests   Final    BOTTLES DRAWN AEROBIC ONLY Blood Culture adequate volume   Culture   Final    NO GROWTH < 24 HOURS Performed at [facility], [place] [address] [place], [place] [address]    Report Status PENDING  Incomplete  Surgical pcr screen     Status: Abnormal   Collection Time: 12/13/18 12:34 AM  Result Value Ref Range Status   MRSA, PCR POSITIVE (A) NEGATIVE Final    Comment: RESULT CALLED TO, READ BACK BY AND VERIFIED WITH: BELANGER,T RN 12/13/2018 AT 0236 SKEEN,P    Staphylococcus aureus POSITIVE (A) NEGATIVE Final    Comment: (NOTE) The Xpert SA Assay (FDA approved for NASAL specimens in patients 30 years of age and older), is one component of a comprehensive surveillance program. It is not intended to diagnose infection nor to guide or monitor treatment. Performed at [facility], [place] [address] [place], [place] [address]   Aerobic/Anaerobic Culture (surgical/deep wound)     Status: None (Preliminary result)   Collection Time: 12/13/18  2:13 PM  Result Value Ref Range Status   Specimen Description WOUND LUMBAR 3/4 EPIDURAL FLUID  Final   Special Requests NONE  Final   Gram Stain   Final    RARE WBC PRESENT,BOTH PMN AND MONONUCLEAR NO ORGANISMS SEEN Performed at [facility], [place] [address] [place], [place] [address]    Culture PENDING  Incomplete   Report Status PENDING  Incomplete  Aerobic/Anaerobic Culture (surgical/deep wound)     Status: None (Preliminary result)   Collection Time: 12/13/18  2:34 PM  Result Value Ref Range Status    Specimen Description TISSUE  Final   Special Requests L3 L4 DISC SPACE  Final   Gram Stain   Final    ABUNDANT WBC PRESENT,BOTH PMN AND MONONUCLEAR NO ORGANISMS SEEN Performed at [facility], [address] [place], [place] [address]    Culture PENDING  Incomplete   Report Status PENDING  Incomplete    Impression/Plan: The patient is a 58 y/o WM smoker with h/o lumbar microdiscectomy in 8/19 for radiculopathy who recently was receiving epidural steroid injections x 2 now admitted with an epidural abscess and L4-L5 discitis with possible osteomyelitis.  1. Epidural abscess/discitis -as the patient did receive recent epidurals spinal injections, this does place him at risk for more atypical pathogen such as gram-negative's in addition to the typical staph and strep pathogen commonly associated with postoperative wounds.  Patient did have full healing after his initial microdiscectomy and denies any drainage chronically from his surgical site last summer.  Will continue vancomycin and Rocephin while operative cultures are in process (tentatively growing staph on one cx).  Check a CRP to establish a baseline of inflammation.  I anticipate an 8-week course of likely IV antibiotics for treatment medically of his condition.  Given the wedge deformity noted on his recent MRI, the patient may benefit from a back brace while receiving ongoing medical therapy.  Will defer this issue to his neurosurgeons.  2. Leukocytosis -this is likely reactive from the patient's newly discovered epidural abscess and vertebral discitis.  Will continue empiric IV antibiotics as noted above.  Would repeat the patient's CBC with differential daily until the patient's white blood cell count has returned to within normal limits consistently.  3. Tobacco abuse -briefly discussed the relationship between ongoing nicotine use and vasoconstriction which often leads to poor wound healing and increased risk for subsequent  infection.  Patient's initial wound from his surgery last summer however did heal completely.  I have encouraged the patient to consider smoking cessation in total perhaps with medication adjunctive assistance if needed in order to reduce his risk for ongoing infection.

## 2018-12-15 DIAGNOSIS — B9562 Methicillin resistant Staphylococcus aureus infection as the cause of diseases classified elsewhere: Secondary | ICD-10-CM

## 2018-12-15 DIAGNOSIS — G062 Extradural and subdural abscess, unspecified: Secondary | ICD-10-CM

## 2018-12-15 LAB — CBC WITH DIFFERENTIAL/PLATELET
Abs Immature Granulocytes: 0.12 10*3/uL — ABNORMAL HIGH (ref 0.00–0.07)
Basophils Absolute: 0.1 10*3/uL (ref 0.0–0.1)
Basophils Relative: 0 %
Eosinophils Absolute: 0.1 10*3/uL (ref 0.0–0.5)
Eosinophils Relative: 0 %
HCT: 42.9 % (ref 39.0–52.0)
Hemoglobin: 14.6 g/dL (ref 13.0–17.0)
Immature Granulocytes: 1 %
Lymphocytes Relative: 9 %
Lymphs Abs: 1.5 10*3/uL (ref 0.7–4.0)
MCH: 29.5 pg (ref 26.0–34.0)
MCHC: 34 g/dL (ref 30.0–36.0)
MCV: 86.7 fL (ref 80.0–100.0)
Monocytes Absolute: 1.2 10*3/uL — ABNORMAL HIGH (ref 0.1–1.0)
Monocytes Relative: 7 %
Neutro Abs: 14.5 10*3/uL — ABNORMAL HIGH (ref 1.7–7.7)
Neutrophils Relative %: 83 %
Platelets: 376 10*3/uL (ref 150–400)
RBC: 4.95 MIL/uL (ref 4.22–5.81)
RDW: 13.1 % (ref 11.5–15.5)
WBC: 17.5 10*3/uL — ABNORMAL HIGH (ref 4.0–10.5)
nRBC: 0 % (ref 0.0–0.2)

## 2018-12-15 LAB — VANCOMYCIN, TROUGH: Vancomycin Tr: 16 ug/mL (ref 15–20)

## 2018-12-15 LAB — CREATININE, SERUM
Creatinine, Ser: 0.78 mg/dL (ref 0.61–1.24)
GFR calc Af Amer: >60 mL/min (ref >60)
GFR calc non Af Amer: >60 mL/min (ref >60)

## 2018-12-15 LAB — C-REACTIVE PROTEIN: CRP: 16.2 mg/dL — ABNORMAL HIGH (ref <1.0)

## 2018-12-15 MED ORDER — VANCOMYCIN IV (FOR PTA / DISCHARGE USE ONLY): 1250.0000 mg | Freq: Two times a day (BID) | INTRAVENOUS | 0 refills | Status: DC

## 2018-12-15 MED ORDER — HEPARIN SOD (PORK) LOCK FLUSH 100 UNIT/ML IV SOLN
250.0000 [IU] | INTRAVENOUS | Status: AC | PRN
  Administered 2018-12-15: 250 [IU]

## 2018-12-15 MED ORDER — CYCLOBENZAPRINE HCL 10 MG PO TABS: 10.0000 mg | ORAL_TABLET | Freq: Three times a day (TID) | ORAL | 0 refills | Status: DC | PRN

## 2018-12-15 MED ORDER — OXYCODONE-ACETAMINOPHEN 5-325 MG PO TABS: 1.0000 | ORAL_TABLET | ORAL | 0 refills | Status: DC | PRN

## 2018-12-15 NOTE — Progress Notes (Signed)
[facility] for Infectious Disease   Reason for visit: Follow up on lumbar epidural abscess/discitis  Antibiotics: Vancomycin, day 3 Rocephin, day 3    Interval History: MRSA now confirmed from pt's spinal wound. Anxious to go home but awaiting vanc level this afternoon to determine dose. Anticipate d/c home tomorrow. Lumbar drain removed 2 hours ago. Denies BM since surgery but appetite is good. No labs obtained post-op yet.  Fever curve, cx, and WBC trends all independently reviewed   Physical Exam: Vitals:   12/15/18 0834 12/15/18 1230  BP: (!) 125/109 129/86  Pulse: 96 91  Resp: 18 18  Temp: 97.6 F (36.4 C) 97.7 F (36.5 C)  SpO2: 96% 95%  Gen: pleasant, moderate distress secondary to low back pain, A&Ox 3 Head: NCAT, no temporal wasting evident EENT: PERRL, EOMI, MMM, adequate dentition Neck: supple, no JVD CV: NRRR, no murmurs evident Pulm: CTA bilaterally, mild wheeze noted, no retractions Abd: soft, NTND, +BS (hypoactive) Extrems:  No LE edema, 2+ pulses MSK: lumbar incision c/d/i Skin: no rashes, adequate skin turgor Neuro: CN II-XII grossly intact, no focal neurologic deficits appreciated, gait was not assessed, A&Ox 3  Review of Systems: +lumbar back pain for ~6 weeks, occasional paresthesias to his LEs during this time (none now though). ?F/C, No N/V/diarrhea, SOB, or HA. All other systems reviewed and are negative    Lab Results  Component Value Date   WBC 16.5 (H) 12/12/2018   HGB 15.6 12/12/2018   HCT 45.5 12/12/2018   MCV 86.3 12/12/2018   PLT 406 (H) 12/12/2018    Lab Results  Component Value Date   CREATININE 0.74 12/12/2018   BUN 12 12/12/2018   NA 139 12/12/2018   K 3.8 12/12/2018   CL 101 12/12/2018   CO2 23 12/12/2018   No results found for: ALT, AST, GGT, ALKPHOS   Microbiology: Recent Results (from the past 240 hour(s))  Culture, blood (routine x 2)     Status: None (Preliminary result)   Collection Time: 12/12/18  6:48 PM   Result Value Ref Range Status   Specimen Description BLOOD LEFT HAND  Final   Special Requests   Final    BOTTLES DRAWN AEROBIC ONLY Blood Culture adequate volume   Culture   Final    NO GROWTH 3 DAYS Performed at [facility], [place] [address], [place], [place] [address]    Report Status PENDING  Incomplete  Culture, blood (routine x 2)     Status: None (Preliminary result)   Collection Time: 12/12/18  7:01 PM  Result Value Ref Range Status   Specimen Description BLOOD RIGHT HAND  Final   Special Requests   Final    BOTTLES DRAWN AEROBIC ONLY Blood Culture adequate volume   Culture   Final    NO GROWTH 3 DAYS Performed at [facility], [place] [address], [place], [place] [address]    Report Status PENDING  Incomplete  Surgical pcr screen     Status: Abnormal   Collection Time: 12/13/18 12:34 AM  Result Value Ref Range Status   MRSA, PCR POSITIVE (A) NEGATIVE Final    Comment: RESULT CALLED TO, READ BACK BY AND VERIFIED WITH: BELANGER,T RN 12/13/2018 AT 0236 SKEEN,P    Staphylococcus aureus POSITIVE (A) NEGATIVE Final    Comment: (NOTE) The Xpert SA Assay (FDA approved for NASAL specimens in patients 28 years of age and older), is one component of a comprehensive surveillance program. It is  not intended to diagnose infection nor to guide or monitor treatment. Performed at [facility], [place] [address], [place], [place] [address]   Fungus Culture With Stain     Status: None (Preliminary result)   Collection Time: 12/13/18  2:13 PM  Result Value Ref Range Status   Fungus Stain Final report  Final    Comment: (NOTE) Performed At: [facility] [place], [place] [id number] Rush Farmer MD PY:[id number]    Fungus (Mycology) Culture PENDING  Incomplete   Fungal Source WOUND  Final    Comment: LUMBAR 3/4 EPIDURAL FLUID Performed at [facility], [place] [address] [place], [place] [address]   Aerobic/Anaerobic Culture (surgical/deep  wound)     Status: None (Preliminary result)   Collection Time: 12/13/18  2:13 PM  Result Value Ref Range Status   Specimen Description WOUND LUMBAR 3/4 EPIDURAL FLUID  Final   Special Requests NONE  Final   Gram Stain   Final    RARE WBC PRESENT,BOTH PMN AND MONONUCLEAR NO ORGANISMS SEEN    Culture   Final    CULTURE REINCUBATED FOR BETTER GROWTH Performed at [facility], [place] [address] [place], [place] [address]    Report Status PENDING  Incomplete  Fungus Culture Result     Status: None   Collection Time: 12/13/18  2:13 PM  Result Value Ref Range Status   Result 1 Comment  Final    Comment: (NOTE) KOH/Calcofluor preparation:  no fungus observed. Performed At: [facility] [place], [place] [id number] Rush Farmer MD BH:[id number]   Fungus Culture With Stain     Status: None (Preliminary result)   Collection Time: 12/13/18  2:34 PM  Result Value Ref Range Status   Fungus Stain Final report  Final    Comment: (NOTE) Performed At: [facility] [place], [place] [id number] Rush Farmer MD [id number]    Fungus (Mycology) Culture PENDING  Incomplete   Fungal Source TISSUE  Final    Comment: L3 L4 DISC SPACE Performed at [facility], [place] [address] [place], [place] [address]   Aerobic/Anaerobic Culture (surgical/deep wound)     Status: None (Preliminary result)   Collection Time: 12/13/18  2:34 PM  Result Value Ref Range Status   Specimen Description TISSUE  Final   Special Requests L3 L4 DISC SPACE  Final   Gram Stain   Final    ABUNDANT WBC PRESENT,BOTH PMN AND MONONUCLEAR NO ORGANISMS SEEN Performed at [facility], [place] [address], [place], [place] [address]    Culture   Final    RARE METHICILLIN RESISTANT STAPHYLOCOCCUS AUREUS NO ANAEROBES ISOLATED; CULTURE IN PROGRESS FOR 5 DAYS    Report Status PENDING  Incomplete   Organism ID, Bacteria METHICILLIN RESISTANT STAPHYLOCOCCUS AUREUS  Final       Susceptibility   Methicillin resistant staphylococcus aureus - MIC*    CIPROFLOXACIN >=8 RESISTANT Resistant     ERYTHROMYCIN >=8 RESISTANT Resistant     GENTAMICIN <=0.5 SENSITIVE Sensitive     OXACILLIN >=4 RESISTANT Resistant     TETRACYCLINE <=1 SENSITIVE Sensitive     VANCOMYCIN 1 SENSITIVE Sensitive     TRIMETH/SULFA <=10 SENSITIVE Sensitive     CLINDAMYCIN RESISTANT Resistant     RIFAMPIN <=0.5 SENSITIVE Sensitive     Inducible Clindamycin POSITIVE Resistant     * RARE METHICILLIN RESISTANT STAPHYLOCOCCUS AUREUS  Fungus Culture Result  Status: None   Collection Time: 12/13/18  2:34 PM  Result Value Ref Range Status   Result 1 Comment  Final    Comment: (NOTE) KOH/Calcofluor preparation:  no fungus observed. Performed At: [facility] [place], [place] [id number] Rush Farmer MD HA:[id number]     Impression/Plan: The patient is a 58 y/o WM smoker with h/o lumbar microdiscectomy in 8/19 for radiculopathy who recently was receiving epidural steroid injections x 2 now admitted with an epidural abscess and L4-L5 discitis with possible osteomyelitis.  1. Epidural abscess/discitis -as the patient did receive recent epidurals spinal injections, this does place him at risk for more atypical pathogen such as gram-negative's in addition to the typical staph and strep pathogen commonly associated with postoperative wounds.  Patient did have full healing after his initial microdiscectomy and denies any drainage chronically from his surgical site last summer.  Now that MRSA has been confirmed, will d/c rocephin and continue vancomycin.  Check a CRP to establish a baseline of inflammation.  I anticipate an 8-week course of likely IV antibiotics for treatment medically of his condition.  Will await vanc level this afternoon to determine best dosing in anticipation of d/c home tomorrow. Given the wedge deformity noted on his recent MRI, the patient may benefit from a  back brace while receiving ongoing medical therapy.  Will defer this issue to his neurosurgeons. > 20 minutes of time spent on d/c planning alone today.  2. Leukocytosis -this is likely reactive from the patient's newly discovered epidural abscess and vertebral discitis.  Will adjust IV antibiotics as noted above.  Will repeat the patient's CBC with differential now as none have been repeated post-op yet and daily until the patient's white blood cell count has returned to within normal limits consistently. Emerging concern for narcotic ileus.  3. Tobacco abuse -briefly discussed the relationship between ongoing nicotine use and vasoconstriction which often leads to poor wound healing and increased risk for subsequent infection.  Patient's initial wound from his surgery last summer however did heal completely.  I have encouraged the patient to consider smoking cessation in total perhaps with medication adjunctive assistance if needed in order to reduce his risk for ongoing infection.

## 2018-12-15 NOTE — Progress Notes (Signed)
Subjective: Patient reports Patient is doing well complete resolution of preoperative radicular pain condition of back pain slowly improving  Objective: Vital signs in last 24 hours: Temp:  [97.7 F (36.5 C)-98.7 F (37.1 C)] 98.7 F (37.1 C) (04/24 0349) Pulse Rate:  [68-113] 97 (04/24 0349) Resp:  [16-18] 18 (04/24 0349) BP: (131-152)/(78-98) 135/78 (04/24 0349) SpO2:  [93 %-98 %] 93 % (04/24 0349)  Intake/Output from previous day: 04/23 0701 - 04/24 0700 In: 968.5 [P.O.:100; I.V.:368.5; IV Piggyback:500] Out: 1810 [Urine:1800; Drains:10] Intake/Output this shift: No intake/output data recorded.  Strength 5 out of 5 wound clean dry and intact drain has put out minimal output will DC the drain  Lab Results: Recent Labs    12/12/18 1842  WBC 16.5*  HGB 15.6  HCT 45.5  PLT 406*   BMET Recent Labs    12/12/18 1842  NA 139  K 3.8  CL 101  CO2 23  GLUCOSE 155*  BUN 12  CREATININE 0.74  CALCIUM 9.7    Studies/Results: Dg Lumbar Spine 2-3 Views  Result Date: 12/13/2018 CLINICAL DATA:  Epidural abscess EXAM: LUMBAR SPINE - 2-3 VIEW COMPARISON:  Lumbar MRI 12/11/2018 FINDINGS: Lateral localizing imaging of the lumbar spine. Needle is located between the spinous processes of L3 and L4. Normal alignment. IMPRESSION: Localization L3-4 spinous process. Electronically Signed   By: Franchot Gallo M.D.   On: 12/13/2018 15:16   [place] Ekg Site Rite  Result Date: 12/14/2018 If Site Rite image not attached, placement could not be confirmed due to current cardiac rhythm.   Assessment/Plan: Postop day 2 decompressive lumbar laminectomy for epidural abscess discitis and osteomyelitis.  Gram stain shows white blood cells no bacteria identified yet infectious disease is on board appreciate their help and will await their recommendations for antibiotics to be discharged with.  Patient can be discharged with home health and home health antibiotics when we determine what to send him home  on.  LOS: 3 days     Thomas Simpson P 12/15/2018, 7:15 AM

## 2018-12-15 NOTE — Progress Notes (Signed)
Physical Therapy Treatment Patient Details Name: Thomas Simpson MRN: [id number] DOB: 1960/09/17 Today's Date: 12/15/2018    History of Present Illness Pt is a 58 y/o male admitted secondary ongoing back pain. MRI revealed severe discitis osteomyelitis with a component causing an epidural abscess and compression of the left L4 nerve root. Underwent decompressive lumbar laminectomy and microdiscectomy L3-L4 on 12/13/18. PMH including but not limited to right-sided L4-5 laminectomy microdiscectomy 03/24/18.    PT Comments    Patient seen for mobility progression. Pt is making progress toward PT goals and tolerated session well. Pt presents with balance deficits and requires assistance for OOB mobility.  Pt will continue to benefit from further skilled PT services to maximize independence and safety with mobility.    Follow Up Recommendations  Supervision/Assistance - 24 hour;SNF;Other (comment) if pt refuses, will need HHPT      Equipment Recommendations  3in1 (PT)    Recommendations for Other Services       Precautions / Restrictions Precautions Precautions: Fall;Back Precaution Comments: back precautions reviewed with pt    Mobility  Bed Mobility Overal bed mobility: Needs Assistance Bed Mobility: Rolling;Supine to Sit Rolling: Min assist   Supine to sit: Mod assist     General bed mobility comments: pt up in room with OT upon arrival  Transfers Overall transfer level: Needs assistance Equipment used: Rolling walker (2 wheeled) Transfers: Sit to/from Stand Sit to Stand: Min guard         General transfer comment: min guard for safety  Ambulation/Gait Ambulation/Gait assistance: Min assist Gait Distance (Feet): 160 Feet Assistive device: Rolling walker (2 wheeled) Gait Pattern/deviations: Step-through pattern;Decreased stride length Gait velocity: decreased   General Gait Details: cues for upright posture; decreased cadence; pt unsteady but without overt  LOB   Stairs Stairs: Yes Stairs assistance: Min guard;Min assist Stair Management: One rail Left;Step to pattern;Forwards;With cane Number of Stairs: (2 steps X 2 trials) General stair comments: assist to steady; cues for safety   Wheelchair Mobility    Modified Rankin (Stroke Patients Only)       Balance Overall balance assessment: Needs assistance   Sitting balance-Leahy Scale: Fair     Standing balance support: Bilateral upper extremity supported;During functional activity Standing balance-Leahy Scale: Poor                              Cognition Arousal/Alertness: Awake/alert Behavior During Therapy: WFL for tasks assessed/performed Overall Cognitive Status: Within Functional Limits for tasks assessed                                        Exercises      General Comments General comments (skin integrity, edema, etc.): pt sweaty during session      Pertinent Vitals/Pain Pain Assessment: Faces Faces Pain Scale: Hurts a little bit Pain Location: back Pain Descriptors / Indicators: Sore Pain Intervention(s): Limited activity within patient's tolerance;Monitored during session;Repositioned    Home Living                      Prior Function            PT Goals (current goals can now be found in the care plan section) Acute Rehab PT Goals Patient Stated Goal: decrease pain Progress towards PT goals: Progressing toward goals    Frequency  Min 5X/week      PT Plan Current plan remains appropriate    Co-evaluation              AM-PAC PT "6 Clicks" Mobility   Outcome Measure  Help needed turning from your back to your side while in a flat bed without using bedrails?: A Little Help needed moving from lying on your back to sitting on the side of a flat bed without using bedrails?: A Lot Help needed moving to and from a bed to a chair (including a wheelchair)?: A Lot Help needed standing up from a chair  using your arms (e.g., wheelchair or bedside chair)?: A Little Help needed to walk in hospital room?: A Little Help needed climbing 3-5 steps with a railing? : A Little 6 Click Score: 16    End of Session Equipment Utilized During Treatment: Gait belt Activity Tolerance: Patient tolerated treatment well Patient left: in chair;with call bell/phone within reach Nurse Communication: Mobility status PT Visit Diagnosis: Pain Pain - Right/Left: Left Pain - part of body: Hip     Time: 4536-4680 PT Time Calculation (min) (ACUTE ONLY): 27 min  Charges:  $Gait Training: 23-37 mins                     Earney Navy, PTA Acute Rehabilitation Services Pager: [phone] Office: [phone]     Darliss Cheney 12/15/2018, 4:37 PM

## 2018-12-15 NOTE — TOC Initial Note (Signed)
Transition of Care [facility]) - Initial/Assessment Note    Patient Details  Name: Thomas Simpson MRN: [id number] Date of Birth: May 29, 1961  [facility] CM/SW Contact:    Pollie Friar, RN Phone Number: 12/15/2018, 11:08 AM  Clinical Narrative:                 Message left for [facility] for referral. CM will follow up.   Expected Discharge Plan: [place] Barriers to Discharge: Continued Medical Work up   Patient Goals and CMS Choice Patient states their goals for this hospitalization and ongoing recovery are:: to get home with family CMS Medicare.gov Compare Post Acute Care list provided to:: Patient Choice offered to / list presented to : Patient  Expected Discharge Plan and Services Expected Discharge Plan: [place]   Discharge Planning Services: CM Consult Post Acute Care Choice: Home Health, Durable Medical Equipment Living arrangements for the past 2 months: Single Family Home(one level with 4 steps to enter)                 DME Arranged: 3-N-1                    Prior Living Arrangements/Services Living arrangements for the past 2 months: Single Family Home(one level with 4 steps to enter) Lives with:: Adult Children, Spouse Patient language and need for interpreter reviewed:: Yes Do you feel safe going back to the place where you live?: Yes      Need for Family Participation in Patient Care: Yes (Comment)(needs 24 hour support and supervision) Care giver support system in place?: Yes (comment)(wife and daughter able to provide 24 hour supervision and support) Current home services: DME(walker, cane) Criminal Activity/Legal Involvement Pertinent to Current Situation/Hospitalization: No - Comment as needed  Activities of Daily Living Home Assistive Devices/Equipment: Eyeglasses ADL Screening (condition at time of admission) Patient's cognitive ability adequate to safely complete daily activities?: Yes Is the  patient deaf or have difficulty hearing?: No Does the patient have difficulty seeing, even when wearing glasses/contacts?: No Does the patient have difficulty concentrating, remembering, or making decisions?: No Patient able to express need for assistance with ADLs?: Yes Does the patient have difficulty dressing or bathing?: No Independently performs ADLs?: Yes (appropriate for developmental age) Does the patient have difficulty walking or climbing stairs?: Yes Weakness of Legs: Both Weakness of Arms/Hands: None  Permission Sought/Granted Permission sought to share information with : Family Supports Permission granted to share information with : Yes, Verbal Permission Granted  Share Information with NAME: Mickel Baas     Permission granted to share info w Relationship: wife  Permission granted to share info w Contact Information: [phone]  Emotional Assessment Appearance:: Appears stated age Attitude/Demeanor/Rapport: Engaged Affect (typically observed): Accepting, Appropriate, Pleasant Orientation: : Oriented to Self, Oriented to Place, Oriented to  Time, Oriented to Situation   Psych Involvement: No (comment)  Admission diagnosis:  Lumbar epideral abscess Patient Active Problem List   Diagnosis Date Noted  . Diskitis 12/13/2018  . Osteomyelitis ([place]) 12/12/2018   PCP:  [facility], [place]:   [id number] [place], [place] [address] [facility]  [address] Phone: [phone] Fax: [phone]     Social Determinants of Health (SDOH) Interventions    Readmission Risk Interventions No flowsheet data found.

## 2018-12-15 NOTE — Progress Notes (Signed)
Pharmacy Antibiotic Note  DEBORAH DONDERO is a 58 y.o. male admitted on 12/12/2018 with MRI that looks like discitis osteomyelitis and epidural abscess (noted with recent laminectomy for evacuation of epidural abscess and decompression) .  Pharmacy has been consulted for vancomycin dosing.  WBC 17.5, CRP 16.2, Scr 0.78. Vancomycin trough approximately ~10 hours after last dose came back at 16. Vancomycin peak canceled by RN because patient is actively discharging. Anticipate at steady state trough is close to goal range.   Plan: -Continue vancomycin 1250mg  IV q12h  -Would recommend levels as indicated per renal fx and within next week to ensure remains in goal range -Will follow renal function, cultures and clinical progress  Height: 5\' 10"  (177.8 cm) Weight: 174 lb 9.7 oz (79.2 kg) IBW/kg (Calculated) : 73  Temp (24hrs), Avg:97.8 F (36.6 C), Min:97.5 F (36.4 C), Max:98.7 F (37.1 C)  Recent Labs  Lab 12/12/18 1842 12/15/18 1626  WBC 16.5* 17.5*  CREATININE 0.74 0.78  VANCOTROUGH  --  16    Estimated Creatinine Clearance: 106.5 mL/min (by C-G formula based on SCr of 0.78 mg/dL).    No Known Allergies  Antimicrobials this admission: Vanc 4/22>4/24  Dose adjustments this admission: VT 16 - continue same regimen  Microbiology results: 4/22 lumbar wound 4/21 blood x2- ngtd  Thank you for allowing pharmacy to be a part of this patient's care.  Antonietta Jewel, PharmD, BCCCP Clinical Pharmacist  Pager: [phone] Phone: [phone] **Pharmacist phone directory can now be found on [email or web address] ([email or web address]).  Listed under [place].

## 2018-12-15 NOTE — Progress Notes (Addendum)
PHARMACY CONSULT NOTE FOR:  OUTPATIENT  PARENTERAL ANTIBIOTIC THERAPY (OPAT)  Indication: MRSA osteomyelitis  Regimen: Vancomycin 1250 mg  IV q 12 hours  End date: June 16th, 2020   IV antibiotic discharge orders are pended. To discharging provider:  please sign these orders via discharge navigator,  Select New Orders & click on the button choice - Manage This Unsigned Work.     Thank you for allowing pharmacy to be a part of this patient's care.  Jimmy Footman, PharmD, BCPS, [place] [facility] Clinical Pharmacist Phone: [phone] 12/15/2018, 2:03 PM

## 2018-12-15 NOTE — Progress Notes (Signed)
Occupational Therapy Treatment Patient Details Name: Thomas Simpson MRN: [id number] DOB: 14-Mar-1961 Today's Date: 12/15/2018    History of present illness Pt is a 58 y/o male admitted secondary ongoing back pain. MRI revealed severe discitis osteomyelitis with a component causing an epidural abscess and compression of the left L4 nerve root. Underwent decompressive lumbar laminectomy and microdiscectomy L3-L4 on 12/13/18. PMH including but not limited to right-sided L4-5 laminectomy microdiscectomy 03/24/18.   OT comments  Pt completed bed mobility, basic transfer and toilet transfer min (A). Pt sweating heavily with task preformed this session. Pt will have (A) of wife and daughter per patient upon d/c home.   Follow Up Recommendations  Home health OT;Supervision/Assistance - 24 hour    Equipment Recommendations  3 in 1 bedside commode    Recommendations for Other Services      Precautions / Restrictions Precautions Precautions: Fall;Back       Mobility Bed Mobility Overal bed mobility: Needs Assistance Bed Mobility: Rolling;Supine to Sit Rolling: Min assist   Supine to sit: Mod assist     General bed mobility comments: requires use of bed rail to pull into side lying . pt requires (A) to elevate trunk from bed surface and reports R forearm weaknes  Transfers Overall transfer level: Needs assistance Equipment used: Rolling walker (2 wheeled) Transfers: Sit to/from Stand Sit to Stand: Min guard         General transfer comment: pt able to power up with incr time. pt talking out loud to himself to help with motivation to attempt    Balance Overall balance assessment: Needs assistance         Standing balance support: Bilateral upper extremity supported;During functional activity Standing balance-Leahy Scale: Poor                             ADL either performed or assessed with clinical judgement   ADL Overall ADL's : Needs  assistance/impaired Eating/Feeding: Independent   Grooming: Wash/dry hands;Standing;Min guard               Lower Body Dressing: Total assistance;Sit to/from stand Lower Body Dressing Details (indicate cue type and reason): total (A) to don socks. reports i guess my wife or daughter will have to help me Toilet Transfer: Minimal assistance;RW;BSC Toilet Transfer Details (indicate cue type and reason): pt requires (A) to descend slowly to seat surface Toileting- Clothing Manipulation and Hygiene: Minimal assistance;Sit to/from stand Toileting - Clothing Manipulation Details (indicate cue type and reason): pt abel to complete peri care with min (A) for balance     Functional mobility during ADLs: Min guard;Rolling walker General ADL Comments: pt requires incr time and effort for bed mobility. pt with incr time in bathroom to void due to prolonged time wihtout voiding     Vision       Perception     Praxis      Cognition Arousal/Alertness: Awake/alert Behavior During Therapy: WFL for tasks assessed/performed Overall Cognitive Status: Within Functional Limits for tasks assessed                                          Exercises     Shoulder Instructions       General Comments very sweaty on arrival. pt not out of bed all day. pt educated on oob x4 prior to  all meals every day and once before bed time    Pertinent Vitals/ Pain       Pain Assessment: Faces Faces Pain Scale: Hurts a little bit Pain Location: back Pain Descriptors / Indicators: Sore Pain Intervention(s): Monitored during session;Premedicated before session;Repositioned  Home Living                                          Prior Functioning/Environment              Frequency  Min 3X/week        Progress Toward Goals  OT Goals(current goals can now be found in the care plan section)  Progress towards OT goals: Progressing toward goals  Acute Rehab OT  Goals Patient Stated Goal: decrease pain OT Goal Formulation: With patient Time For Goal Achievement: 12/28/18 Potential to Achieve Goals: Good ADL Goals Pt Will Perform Grooming: with min guard assist;standing Pt Will Perform Lower Body Bathing: with supervision;with set-up;with adaptive equipment;sit to/from stand Pt Will Perform Lower Body Dressing: with supervision;sit to/from stand;with adaptive equipment Pt Will Transfer to Toilet: with supervision;ambulating;bedside commode Pt Will Perform Toileting - Clothing Manipulation and hygiene: with supervision;sit to/from stand Additional ADL Goal #1: Pt will state 3/3 back precautions. Additional ADL Goal #2: Pt will perform bed mobility using log roll technique with supervision. Additional ADL Goal #3: Pt will sit EOB unsupported by UEs x 5 minutes in preparation for ADL.  Plan Discharge plan remains appropriate    Co-evaluation                 AM-PAC OT "6 Clicks" Daily Activity     Outcome Measure   Help from another person eating meals?: A Little Help from another person taking care of personal grooming?: A Little Help from another person toileting, which includes using toliet, bedpan, or urinal?: A Little Help from another person bathing (including washing, rinsing, drying)?: A Lot Help from another person to put on and taking off regular upper body clothing?: A Lot Help from another person to put on and taking off regular lower body clothing?: Total 6 Click Score: 14    End of Session Equipment Utilized During Treatment: Gait belt;Rolling walker  OT Visit Diagnosis: Unsteadiness on feet (R26.81);Other abnormalities of gait and mobility (R26.89);Pain;Muscle weakness (generalized) (M62.81)   Activity Tolerance Patient tolerated treatment well   Patient Left Other (comment)(PTA Warren Lacy)   Nurse Communication Mobility status;Precautions        Time: [phone] OT Time Calculation (min): 48 min  Charges: OT  General Charges $OT Visit: 1 Visit OT Treatments $Self Care/Home Management : 38-52 mins   Jeri Modena, OTR/L  Acute Rehabilitation Services Pager: [phone] Office: [phone] .    Jeri Modena 12/15/2018, 3:24 PM

## 2018-12-15 NOTE — Discharge Summary (Signed)
Physician Discharge Summary  Patient ID: Thomas Simpson MRN: [id number] DOB/AGE: Apr 09, 1961 58 y.o.  Admit date: 12/12/2018 Discharge date: 12/15/2018  Admission Diagnoses: osteomyelitis l3-4, discitis epidural abscess l3-4   Discharge Diagnoses: same   Discharged Condition: good  Hospital Course: The patient was admitted on 12/12/2018 and taken to the operating room where the patient underwent decompressive lumbar laminectomy l3-4. The patient tolerated the procedure well and was taken to the recovery room and then to the floor in stable condition. The hospital course was routine. There were no complications. The wound remained clean dry and intact. Pt had appropriate back soreness. No complaints of leg pain or new N/T/W. The patient remained afebrile with stable vital signs, and tolerated a regular diet. The patient continued to increase activities, and pain was well controlled with oral pain medications.   Consults: ID  Significant Diagnostic Studies:  Results for orders placed or performed during the hospital encounter of 12/12/18  Culture, blood (routine x 2)  Result Value Ref Range   Specimen Description BLOOD LEFT HAND    Special Requests      BOTTLES DRAWN AEROBIC ONLY Blood Culture adequate volume   Culture      NO GROWTH 3 DAYS Performed at [facility], [place] [address] [place], [place] [address]    Report Status PENDING   Culture, blood (routine x 2)  Result Value Ref Range   Specimen Description BLOOD RIGHT HAND    Special Requests      BOTTLES DRAWN AEROBIC ONLY Blood Culture adequate volume   Culture      NO GROWTH 3 DAYS Performed at [facility], [place] [address], [place],  [address]    Report Status PENDING   Surgical pcr screen  Result Value Ref Range   MRSA, PCR POSITIVE (A) NEGATIVE   Staphylococcus aureus POSITIVE (A) NEGATIVE  Fungus Culture With Stain  Result Value Ref Range   Fungus Stain Final report    Fungus (Mycology) Culture  PENDING    Fungal Source WOUND   Aerobic/Anaerobic Culture (surgical/deep wound)  Result Value Ref Range   Specimen Description WOUND LUMBAR 3/4 EPIDURAL FLUID    Special Requests NONE    Gram Stain      RARE WBC PRESENT,BOTH PMN AND MONONUCLEAR NO ORGANISMS SEEN    Culture      CULTURE REINCUBATED FOR BETTER GROWTH Performed at [facility], [place] [address] [place],  [address]    Report Status PENDING   Fungus Culture With Stain  Result Value Ref Range   Fungus Stain Final report    Fungus (Mycology) Culture PENDING    Fungal Source TISSUE   Aerobic/Anaerobic Culture (surgical/deep wound)  Result Value Ref Range   Specimen Description TISSUE    Special Requests L3 L4 DISC SPACE    Gram Stain      ABUNDANT WBC PRESENT,BOTH PMN AND MONONUCLEAR NO ORGANISMS SEEN Performed at [facility], [place] [address], [place], [place] [address]    Culture      RARE METHICILLIN RESISTANT STAPHYLOCOCCUS AUREUS NO ANAEROBES ISOLATED; CULTURE IN PROGRESS FOR 5 DAYS    Report Status PENDING    Organism ID, Bacteria METHICILLIN RESISTANT STAPHYLOCOCCUS AUREUS       Susceptibility   Methicillin resistant staphylococcus aureus - MIC*    CIPROFLOXACIN >=8 RESISTANT Resistant     ERYTHROMYCIN >=8 RESISTANT Resistant     GENTAMICIN <=0.5 SENSITIVE Sensitive     OXACILLIN >=4  RESISTANT Resistant     TETRACYCLINE <=1 SENSITIVE Sensitive     VANCOMYCIN 1 SENSITIVE Sensitive     TRIMETH/SULFA <=10 SENSITIVE Sensitive     CLINDAMYCIN RESISTANT Resistant     RIFAMPIN <=0.5 SENSITIVE Sensitive     Inducible Clindamycin POSITIVE Resistant     * RARE METHICILLIN RESISTANT STAPHYLOCOCCUS AUREUS  Fungus Culture Result  Result Value Ref Range   Result 1 Comment   Fungus Culture Result  Result Value Ref Range   Result 1 Comment   Basic metabolic panel  Result Value Ref Range   Sodium 139 135 - 145 mmol/L   Potassium 3.8 3.5 - 5.1 mmol/L   Chloride 101 98 - 111 mmol/L   CO2 23 22  - 32 mmol/L   Glucose, Bld 155 (H) 70 - 99 mg/dL   BUN 12 6 - 20 mg/dL   Creatinine, Ser 0.74 0.61 - 1.24 mg/dL   Calcium 9.7 8.9 - 10.3 mg/dL   GFR calc non Af Amer >60 >60 mL/min   GFR calc Af Amer >60 >60 mL/min   Anion gap 15 5 - 15  CBC with Differential/Platelet  Result Value Ref Range   WBC 16.5 (H) 4.0 - 10.5 K/uL   RBC 5.27 4.22 - 5.81 MIL/uL   Hemoglobin 15.6 13.0 - 17.0 g/dL   HCT 45.5 39.0 - 52.0 %   MCV 86.3 80.0 - 100.0 fL   MCH 29.6 26.0 - 34.0 pg   MCHC 34.3 30.0 - 36.0 g/dL   RDW 13.2 11.5 - 15.5 %   Platelets 406 (H) 150 - 400 K/uL   nRBC 0.0 0.0 - 0.2 %   Neutrophils Relative % 82 %   Neutro Abs 13.8 (H) 1.7 - 7.7 K/uL   Lymphocytes Relative 12 %   Lymphs Abs 1.9 0.7 - 4.0 K/uL   Monocytes Relative 4 %   Monocytes Absolute 0.6 0.1 - 1.0 K/uL   Eosinophils Relative 1 %   Eosinophils Absolute 0.1 0.0 - 0.5 K/uL   Basophils Relative 0 %   Basophils Absolute 0.1 0.0 - 0.1 K/uL   Immature Granulocytes 1 %   Abs Immature Granulocytes 0.09 (H) 0.00 - 0.07 K/uL  Protime-INR  Result Value Ref Range   Prothrombin Time 13.6 11.4 - 15.2 seconds   INR 1.1 0.8 - 1.2  Sedimentation rate  Result Value Ref Range   Sed Rate 54 (H) 0 - 16 mm/hr  C-reactive protein  Result Value Ref Range   CRP 6.7 (H) <1.0 mg/dL  CBC with Differential/Platelet  Result Value Ref Range   WBC 17.5 (H) 4.0 - 10.5 K/uL   RBC 4.95 4.22 - 5.81 MIL/uL   Hemoglobin 14.6 13.0 - 17.0 g/dL   HCT 42.9 39.0 - 52.0 %   MCV 86.7 80.0 - 100.0 fL   MCH 29.5 26.0 - 34.0 pg   MCHC 34.0 30.0 - 36.0 g/dL   RDW 13.1 11.5 - 15.5 %   Platelets 376 150 - 400 K/uL   nRBC 0.0 0.0 - 0.2 %   Neutrophils Relative % 83 %   Neutro Abs 14.5 (H) 1.7 - 7.7 K/uL   Lymphocytes Relative 9 %   Lymphs Abs 1.5 0.7 - 4.0 K/uL   Monocytes Relative 7 %   Monocytes Absolute 1.2 (H) 0.1 - 1.0 K/uL   Eosinophils Relative 0 %   Eosinophils Absolute 0.1 0.0 - 0.5 K/uL   Basophils Relative 0 %   Basophils Absolute 0.1  0.0 -  0.1 K/uL   Immature Granulocytes 1 %   Abs Immature Granulocytes 0.12 (H) 0.00 - 0.07 K/uL    Dg Lumbar Spine 2-3 Views  Result Date: 12/13/2018 CLINICAL DATA:  Epidural abscess EXAM: LUMBAR SPINE - 2-3 VIEW COMPARISON:  Lumbar MRI 12/11/2018 FINDINGS: Lateral localizing imaging of the lumbar spine. Needle is located between the spinous processes of L3 and L4. Normal alignment. IMPRESSION: Localization L3-4 spinous process. Electronically Signed   By: Franchot Gallo M.D.   On: 12/13/2018 15:16   Mr Lumbar Spine W Wo Contrast  Result Date: 12/11/2018 CLINICAL DATA:  Severe low back pain radiating down the left leg for the past 2 months. Prior surgery in August 2019. EXAM: MRI LUMBAR SPINE WITHOUT AND WITH CONTRAST TECHNIQUE: Multiplanar and multiecho pulse sequences of the lumbar spine were obtained without and with intravenous contrast. CONTRAST:  85m MULTIHANCE GADOBENATE DIMEGLUMINE 529 MG/ML IV SOLN COMPARISON:  MR lumbar spine dated September 05, 2018. FINDINGS: Segmentation:  Standard. Alignment: Unchanged mild dextroscoliosis. Sagittal alignment is maintained. Vertebrae: New osteomyelitis discitis at L3-L4 with fluid and enhancement of the disc space, erosion of the endplates, and confluent marrow edema with decreased T1 marrow signal throughout both L3 and L4 vertebral bodies. Mild right-sided endplate marrow edema and enhancement at L4-L5 is unchanged likely degenerative. No fracture or suspicious bone lesion. Conus medullaris and cauda equina: Conus extends to the L1 level. Conus and cauda equina appear normal. No intradural enhancement. Paraspinal and other soft tissues: Prominent paravertebral inflammatory changes at L3-L4 extending into both psoas muscles with tiny 5 mm rim enhancing fluid collection in the left psoas muscle. There is a larger incompletely visualized 1.6 cm abscess in the inferior left psoas muscle at the level of the sacral ala. Prominent anterior epidural phlegmon  extending from the superior L3 endplate to the inferior L4 endplate with small 6 x 3 x 8 mm epidural abscess on the left behind the L3 vertebral body. Disc levels: T12-L1:  Negative. L1-L2:  Negative. L2-L3: Negative disc. New severe left lateral recess stenosis due to epidural phlegmon. No spinal canal or neuroforaminal stenosis. L3-L4: New severe spinal canal and bilateral lateral recess stenosis due to disc bulging and anterior epidural phlegmon. Progressive moderate to severe left and severe right neuroforaminal stenosis. L4-L5: Prior right hemilaminectomy. Slightly decreased enhancing granulation tissue along the right epidural space extending into the right subarticular zone inferiorly, adjacent to the descending right L5 nerve root as it exits the thecal sac. There is a new shallow left subarticular and foraminal disc protrusion. Unchanged mild bilateral facet arthropathy. New mild to moderate left lateral recess stenosis. Unchanged moderate right and mild-to-moderate left neuroforaminal stenosis. Resolved spinal canal stenosis. L5-S1: Unchanged shallow broad-based posterior disc protrusion with annular fissure. No stenosis. IMPRESSION: 1. New osteomyelitis discitis at L3-L4 with prominent anterior epidural phlegmon and small 6 x 3 x 8 mm epidural abscess resulting in severe spinal canal and lateral recess stenosis with progressive moderate to severe left and severe right neuroforaminal stenosis. Additional new severe left lateral recess stenosis at L2-L3 due to epidural phlegmon. 2. Prominent paravertebral inflammatory changes at L3-L4 with tiny 5 mm abscess in the left psoas muscle at this level and larger, incompletely visualized 1.6 cm abscess in the inferior left psoas muscle at the level of the sacral ala. 3. Decreasing granulation tissue in the surgical bed and right subarticular zone at L4-L5. New shallow left sided disc protrusion at this level with new mild to moderate left lateral recess stenosis.  Unchanged moderate right and mild-to-moderate left neuroforaminal stenosis. These results will be called to the ordering clinician or representative by the Radiologist Assistant, and communication documented in the PACS or zVision Dashboard. Electronically Signed   By: Titus Dubin M.D.   On: 12/11/2018 16:44   [place] Ekg Site Rite  Result Date: 12/14/2018 If Site Rite image not attached, placement could not be confirmed due to current cardiac rhythm.   Antibiotics:  Anti-infectives (From admission, onward)   Start     Dose/Rate Route Frequency Ordered Stop   12/15/18 0000  vancomycin IVPB     1,250 mg Intravenous Every 12 hours 12/15/18 1644 02/06/19 2359   12/14/18 0500  vancomycin (VANCOCIN) 1,250 mg in sodium chloride 0.9 % 250 mL IVPB     1,250 mg 166.7 mL/hr over 90 Minutes Intravenous Every 12 hours 12/13/18 1628     12/13/18 2230  ceFAZolin (ANCEF) IVPB 2g/100 mL premix  Status:  Discontinued     2 g 200 mL/hr over 30 Minutes Intravenous Every 8 hours 12/13/18 1634 12/13/18 1727   12/13/18 1830  cefTRIAXone (ROCEPHIN) 2 g in sodium chloride 0.9 % 100 mL IVPB  Status:  Discontinued     2 g 200 mL/hr over 30 Minutes Intravenous Every 24 hours 12/13/18 1727 12/15/18 1257   12/13/18 1700  vancomycin (VANCOCIN) 500 mg in sodium chloride 0.9 % 100 mL IVPB     500 mg 100 mL/hr over 60 Minutes Intravenous  Once 12/13/18 1628 12/13/18 1823   12/13/18 1615  vancomycin (VANCOCIN) 500 mg in sodium chloride 0.9 % 100 mL IVPB  Status:  Discontinued     500 mg 100 mL/hr over 60 Minutes Intravenous To Post Anesthesia Care Unit 12/13/18 1606 12/13/18 1820   12/13/18 1530  vancomycin (VANCOCIN) IVPB 1000 mg/200 mL premix  Status:  Discontinued     1,000 mg 200 mL/hr over 60 Minutes Intravenous  Once 12/13/18 1511 12/13/18 1606   12/13/18 1500  vancomycin (VANCOCIN) 1,000 mg in sodium chloride 0.9 % 250 mL IVPB  Status:  Discontinued     1,000 mg 250 mL/hr over 60 Minutes Intravenous  Once  12/13/18 1442 12/13/18 1511   12/13/18 1306  bacitracin 50,000 Units in sodium chloride 0.9 % 500 mL irrigation  Status:  Discontinued       As needed 12/13/18 1407 12/13/18 1523      Discharge Exam: Blood pressure 129/86, pulse 91, temperature 97.7 F (36.5 C), temperature source Oral, resp. rate 18, height _0  (1.778 m), weight 79.2 kg, SpO2 95 %. Neurologic: Grossly normal Ambulating and voiding well, incision cdi  Discharge Medications:   Allergies as of 12/15/2018   No Known Allergies     Medication List    TAKE these medications   cyclobenzaprine 10 MG tablet Commonly known as:  FLEXERIL Take 10 mg by mouth 3 (three) times daily. What changed:  Another medication with the same name was added. Make sure you understand how and when to take each.   cyclobenzaprine 10 MG tablet Commonly known as:  FLEXERIL Take 1 tablet (10 mg total) by mouth 3 (three) times daily as needed for muscle spasms. What changed:  You were already taking a medication with the same name, and this prescription was added. Make sure you understand how and when to take each.   Dexilant 60 MG capsule Generic drug:  dexlansoprazole Take 60 mg by mouth every morning.   diclofenac 75 MG EC tablet Commonly known as:  VOLTAREN Take 75 mg by mouth every morning.   MegaRed Omega-3 Krill Oil 500 MG Caps Take 500 mg elemental calcium/kg/hr by mouth daily with breakfast.   methylPREDNISolone 4 MG tablet Commonly known as:  MEDROL Take 4 mg by mouth daily.   oxyCODONE-acetaminophen 5-325 MG tablet Commonly known as:  Percocet Take 1 tablet by mouth every 4 (four) hours as needed for severe pain.   ProAir HFA 108 (90 Base) MCG/ACT inhaler Generic drug:  albuterol Inhale 2 puffs into the lungs every 6 (six) hours as needed for wheezing or shortness of breath.   traMADol 50 MG tablet Commonly known as:  ULTRAM Take 50 mg by mouth 3 (three) times daily as needed (for pain).   vancomycin  IVPB Inject  1,250 mg into the vein every 12 (twelve) hours. Indication:  MRSA osteomyelitis  Last Day of Therapy:  02/06/2019  Labs - _0 /24/20 1105          Disposition: home   Final Dx: decompressive lumbar laminectomy l3-4  Discharge Instructions    Call MD for:  difficulty breathing, headache or visual disturbances   Complete by:  As directed    Call MD for:  extreme fatigue   Complete by:  As directed    Call MD  for:  hives   Complete by:  As directed    Call MD for:  persistant dizziness or light-headedness   Complete by:  As directed    Call MD for:  persistant nausea and vomiting   Complete by:  As directed    Call MD for:  redness, tenderness, or signs of infection (pain, swelling, redness, odor or green/yellow discharge around incision site)   Complete by:  As directed    Call MD for:  severe uncontrolled pain  Complete by:  As directed    Call MD for:  temperature >100.4   Complete by:  As directed    Diet - low sodium heart healthy   Complete by:  As directed    Driving Restrictions   Complete by:  As directed    No driving for 2 weeks, no riding in the car for 1 week   Home infusion instructions [place] May follow [place] Dosing Protocol; May administer Cathflo as needed to maintain patency of vascular access device.; Flushing of vascular access device: per [facility] Protocol: 0.9% NaCl pre/post medica...   Complete by:  As directed    Instructions:  May follow [place] Dosing Protocol   Instructions:  May administer Cathflo as needed to maintain patency of vascular access device.   Instructions:  Flushing of vascular access device: per [facility] Protocol: 0.9% NaCl pre/post medication administration and prn patency; Heparin 100 u/ml, 85m for implanted ports and Heparin 10u/ml, 590mfor all other central venous catheters.   Instructions:  May follow [facility] Anaphylaxis Protocol for First Dose Administration in the home: 0.9% NaCl at 25-50 ml/hr to maintain IV access for protocol meds. Epinephrine 0.3 ml IV/IM PRN and Benadryl 25-50 IV/IM PRN s/s of anaphylaxis.   Instructions:  AdDillonnfusion Coordinator (RN) to assist per patient IV care needs in the home PRN.   Increase activity slowly   Complete by:  As directed    Lifting restrictions   Complete by:  As directed    No lifting more than 8 lbs      Follow-up [facility].   Why:   [phone] will contact you for the first visit.       [facility] Follow up on 01/03/2019.   Specialty:  Infectious Diseases Why:  Appointment with Dr. PoPrince Rome 9:30 am. Please arrive 15 minutes early to register.  Contact information: [phone]         Signed: KiOcie CornfieldeJohnson [facility]/24/2020, 4:45 PM

## 2018-12-15 NOTE — Anesthesia Postprocedure Evaluation (Signed)
Anesthesia Post Note  Patient: Thomas Simpson  Procedure(s) Performed: Left Lumbar three-four Laminectomy for epidural abscess (Left Back)     Patient location during evaluation: PACU Anesthesia Type: General Level of consciousness: awake and alert Pain management: pain level controlled Vital Signs Assessment: post-procedure vital signs reviewed and stable Respiratory status: spontaneous breathing, nonlabored ventilation, respiratory function stable and patient connected to nasal cannula oxygen Cardiovascular status: blood pressure returned to baseline and stable Postop Assessment: no apparent nausea or vomiting Anesthetic complications: no    Last Vitals:  Vitals:   12/14/18 2349 12/15/18 0349  BP: 131/85 135/78  Pulse: 68 97  Resp: 17 18  Temp: 36.5 C 37.1 C  SpO2: 95% 93%    Last Pain:  Vitals:   12/15/18 0656  TempSrc:   PainSc: 6    Pain Goal: Patients Stated Pain Goal: 4 (12/14/18 0900)                 Tamiko Leopard S

## 2018-12-15 NOTE — TOC Progression Note (Signed)
Transition of Care [facility]) - Progression Note    Patient Details  Name: Thomas Simpson MRN: [id number] Date of Birth: 1961-08-04  Transition of Care [facility]) CM/SW Contact  Pollie Friar, RN Phone Number: 12/15/2018, 11:37 AM  Clinical Narrative:    No return call from [facility]. Pts second choice was [place].    Expected Discharge Plan: [place] Barriers to Discharge: Continued Medical Work up  Expected Discharge Plan and Services Expected Discharge Plan: [place]   Discharge Planning Services: CM Consult Post Acute Care Choice: Home Health, Durable Medical Equipment Living arrangements for the past 2 months: Single Family Home(one level with 4 steps to enter)                 DME Arranged: 3-N-1 DME Agency: Other - Comment(family medical suppy--3 in 1 to be delivered to the home) Date DME Agency Contacted: 12/15/18 Time DME Agency Contacted: 5 Representative spoke with at DME Agency: [place]: RN, PT, OT [facility] Agency: [place] ([place]) Date [place]: 12/15/18 Time [place]: 9357 Representative spoke with at [place]: [place] ([place]) Interventions    Readmission Risk Interventions No flowsheet data found.

## 2018-12-15 NOTE — TOC Transition Note (Signed)
Transition of Care [facility]) - CM/SW Discharge Note   Patient Details  Name: Thomas Simpson MRN: [id number] Date of Birth: 05-05-1961  Transition of Care [facility]) CM/SW Contact:  Pollie Friar, RN Phone Number: 12/15/2018, 4:27 PM   Clinical Narrative:    Pt to d/c home. Will have [place] RN meet him at his home in am for first dose of IV vanc.  3 in 1 delivered to his home.  Wife and daughter to provide transport home.   Final next level of care: [place] Barriers to Discharge: Barriers Resolved   Patient Goals and CMS Choice Patient states their goals for this hospitalization and ongoing recovery are:: to get home with family CMS Medicare.gov Compare Post Acute Care list provided to:: Patient Choice offered to / list presented to : Patient  Discharge Placement                       Discharge Plan and Services   Discharge Planning Services: CM Consult Post Acute Care Choice: Home Health, Durable Medical Equipment          DME Arranged: 3-N-1 DME Agency: Other - Comment(family medical suppy--3 in 1 to be delivered to the home) Date DME Agency Contacted: 12/15/18 Time DME Agency Contacted: 1540 Representative spoke with at DME Agency: [place]: RN, PT, OT [facility] Agency: [place] ([place]) Date [place]: 12/15/18 Time [place]: 0867 Representative spoke with at [place]: [place] ([place]) Interventions     Readmission Risk Interventions No flowsheet data found.

## 2018-12-15 NOTE — Progress Notes (Signed)
Offered to ambulate pt in hallway and sit in chair several time s, pt declined. Pt able to move all extremeties and log roll independently.  Ave Filter, RN

## 2018-12-15 NOTE — Progress Notes (Signed)
Pt discharged on wheel chair, to the car, wife awaiting him, discharge instructions given to him, no questions asked.

## 2018-12-16 DIAGNOSIS — M4640 Discitis, unspecified, site unspecified: Secondary | ICD-10-CM | POA: Diagnosis not present

## 2018-12-16 DIAGNOSIS — G061 Intraspinal abscess and granuloma: Secondary | ICD-10-CM | POA: Diagnosis not present

## 2018-12-16 DIAGNOSIS — Z4789 Encounter for other orthopedic aftercare: Secondary | ICD-10-CM | POA: Diagnosis not present

## 2018-12-16 DIAGNOSIS — M869 Osteomyelitis, unspecified: Secondary | ICD-10-CM | POA: Diagnosis not present

## 2018-12-16 DIAGNOSIS — M4646 Discitis, unspecified, lumbar region: Secondary | ICD-10-CM | POA: Diagnosis not present

## 2018-12-17 LAB — CULTURE, BLOOD (ROUTINE X 2)
Culture: NO GROWTH
Culture: NO GROWTH
Special Requests: ADEQUATE
Special Requests: ADEQUATE

## 2018-12-18 ENCOUNTER — Telehealth: Payer: Self-pay | Admitting: Behavioral Health

## 2018-12-18 ENCOUNTER — Encounter: Payer: Self-pay | Admitting: Behavioral Health

## 2018-12-18 LAB — AEROBIC/ANAEROBIC CULTURE W GRAM STAIN (SURGICAL/DEEP WOUND)

## 2018-12-18 LAB — AEROBIC/ANAEROBIC CULTURE (SURGICAL/DEEP WOUND)

## 2018-12-18 NOTE — Telephone Encounter (Signed)
Vancomycin is a very narrow ABX and would NOT cause his diarrhea. He had no BMs while post-op, so he likely did develop an ileus. As this resolves, he will have some loose bowels. I agree with him stopping the oxycodone as this is what caused his ileus. I do not want him to take any imodium while he is on ABX. Emphasize daily yogurt with him while he is on ABX.

## 2018-12-18 NOTE — Telephone Encounter (Signed)
Called Thomas Simpson back, Thomas Simpson was in the background.  Informed bith per Dr. Prince Rome Vancomycin is a narrow ABX and would not cause diarrhea.  He had no BMs while post-op and he likely developed an ileus.  As this resolves, he will have some loose bowels.  Informed them that Dr. Prince Rome agreed with stopping the Oxycodone as this is what contributes to Ileus.  Dr. Prince Rome does not want him to take Imodium or any other medications to stop diarrhea  While on IV Vancomycin.  Encouraged patient to eat yogurt while on antibiotics.  Mr and Mrs, [name] verbalized understanding and stated that a few minutes prior Thomas Simpson went to urinate and it was the first he did not have a stool.  He was appreciative of the call and states he will call the office back if he has any concerns. Thomas Riffle RN

## 2018-12-18 NOTE — Telephone Encounter (Signed)
error 

## 2018-12-18 NOTE — Telephone Encounter (Signed)
Thomas Simpson from [place] called stating patient's wife Thomas Simpson called stating Thomas Simpson was started on Vancomycin 1250mg  Q 12 hours IV on Saturday 12/16/2018.  Per Thomas Simpson patient has been complaining of loose stools since the start of the Vancomycin and wanted Dr. Prince Rome to be aware.  Called Thomas Simpson and his wife.  Thomas Simpson states as of Yesterday 12/17/2018 after 4:00p, he was constipated and he discontinued his oxycodone which he was not taking much of.  He states he drank a few sips of prune juice and has increased his fluid intake.  He states he is going to the bathroom constantly and describes loose stools with a few chunks here and there.  He states when he urinates stools comes out too. He states he cannot control it and sometimes he is unable to make it to the bathroom and his wife will have to clean him up.  Informed Mr and Mrs. Simpson that Dr. Prince Rome would be made aware. Thomas Riffle RN

## 2018-12-19 DIAGNOSIS — G061 Intraspinal abscess and granuloma: Secondary | ICD-10-CM | POA: Diagnosis not present

## 2018-12-19 DIAGNOSIS — Z4789 Encounter for other orthopedic aftercare: Secondary | ICD-10-CM | POA: Diagnosis not present

## 2018-12-19 DIAGNOSIS — M869 Osteomyelitis, unspecified: Secondary | ICD-10-CM | POA: Diagnosis not present

## 2018-12-19 DIAGNOSIS — M4640 Discitis, unspecified, site unspecified: Secondary | ICD-10-CM | POA: Diagnosis not present

## 2018-12-19 DIAGNOSIS — M4646 Discitis, unspecified, lumbar region: Secondary | ICD-10-CM | POA: Diagnosis not present

## 2018-12-21 DIAGNOSIS — M4646 Discitis, unspecified, lumbar region: Secondary | ICD-10-CM | POA: Diagnosis not present

## 2018-12-21 DIAGNOSIS — Z4789 Encounter for other orthopedic aftercare: Secondary | ICD-10-CM | POA: Diagnosis not present

## 2018-12-21 DIAGNOSIS — G061 Intraspinal abscess and granuloma: Secondary | ICD-10-CM | POA: Diagnosis not present

## 2018-12-21 DIAGNOSIS — M4640 Discitis, unspecified, site unspecified: Secondary | ICD-10-CM | POA: Diagnosis not present

## 2018-12-21 DIAGNOSIS — M869 Osteomyelitis, unspecified: Secondary | ICD-10-CM | POA: Diagnosis not present

## 2018-12-22 DIAGNOSIS — M869 Osteomyelitis, unspecified: Secondary | ICD-10-CM | POA: Diagnosis not present

## 2018-12-22 DIAGNOSIS — M4640 Discitis, unspecified, site unspecified: Secondary | ICD-10-CM | POA: Diagnosis not present

## 2018-12-23 DIAGNOSIS — M869 Osteomyelitis, unspecified: Secondary | ICD-10-CM | POA: Diagnosis not present

## 2018-12-23 DIAGNOSIS — M4640 Discitis, unspecified, site unspecified: Secondary | ICD-10-CM | POA: Diagnosis not present

## 2018-12-25 DIAGNOSIS — J45909 Unspecified asthma, uncomplicated: Secondary | ICD-10-CM | POA: Diagnosis not present

## 2018-12-25 DIAGNOSIS — M4640 Discitis, unspecified, site unspecified: Secondary | ICD-10-CM | POA: Diagnosis not present

## 2018-12-25 DIAGNOSIS — M869 Osteomyelitis, unspecified: Secondary | ICD-10-CM | POA: Diagnosis not present

## 2018-12-27 DIAGNOSIS — M4640 Discitis, unspecified, site unspecified: Secondary | ICD-10-CM | POA: Diagnosis not present

## 2018-12-27 DIAGNOSIS — M869 Osteomyelitis, unspecified: Secondary | ICD-10-CM | POA: Diagnosis not present

## 2018-12-28 ENCOUNTER — Emergency Department (HOSPITAL_COMMUNITY): Payer: 59

## 2018-12-28 ENCOUNTER — Other Ambulatory Visit: Payer: Self-pay

## 2018-12-28 ENCOUNTER — Inpatient Hospital Stay (HOSPITAL_COMMUNITY)
Admission: EM | Admit: 2018-12-28 | Discharge: 2019-01-04 | DRG: 682 | Disposition: A | Discharge status: Home / Self Care | Payer: 59 | Attending: Internal Medicine | Admitting: Internal Medicine

## 2018-12-28 ENCOUNTER — Encounter (HOSPITAL_COMMUNITY): Payer: Self-pay | Admitting: Emergency Medicine

## 2018-12-28 DIAGNOSIS — Z79899 Other long term (current) drug therapy: Secondary | ICD-10-CM

## 2018-12-28 DIAGNOSIS — Z8661 Personal history of infections of the central nervous system: Secondary | ICD-10-CM

## 2018-12-28 DIAGNOSIS — T368X5A Adverse effect of other systemic antibiotics, initial encounter: Secondary | ICD-10-CM | POA: Diagnosis present

## 2018-12-28 DIAGNOSIS — Z720 Tobacco use: Secondary | ICD-10-CM | POA: Diagnosis present

## 2018-12-28 DIAGNOSIS — E860 Dehydration: Secondary | ICD-10-CM | POA: Diagnosis present

## 2018-12-28 DIAGNOSIS — G061 Intraspinal abscess and granuloma: Secondary | ICD-10-CM | POA: Diagnosis present

## 2018-12-28 DIAGNOSIS — R000 Tachycardia, unspecified: Secondary | ICD-10-CM | POA: Diagnosis not present

## 2018-12-28 DIAGNOSIS — D649 Anemia, unspecified: Secondary | ICD-10-CM | POA: Diagnosis present

## 2018-12-28 DIAGNOSIS — R509 Fever, unspecified: Secondary | ICD-10-CM | POA: Diagnosis not present

## 2018-12-28 DIAGNOSIS — Z79891 Long term (current) use of opiate analgesic: Secondary | ICD-10-CM

## 2018-12-28 DIAGNOSIS — E876 Hypokalemia: Secondary | ICD-10-CM | POA: Diagnosis present

## 2018-12-28 DIAGNOSIS — J45909 Unspecified asthma, uncomplicated: Secondary | ICD-10-CM | POA: Diagnosis present

## 2018-12-28 DIAGNOSIS — F17210 Nicotine dependence, cigarettes, uncomplicated: Secondary | ICD-10-CM | POA: Diagnosis present

## 2018-12-28 DIAGNOSIS — M868X8 Other osteomyelitis, other site: Secondary | ICD-10-CM

## 2018-12-28 DIAGNOSIS — D72823 Leukemoid reaction: Secondary | ICD-10-CM

## 2018-12-28 DIAGNOSIS — D72829 Elevated white blood cell count, unspecified: Secondary | ICD-10-CM | POA: Diagnosis present

## 2018-12-28 DIAGNOSIS — M4626 Osteomyelitis of vertebra, lumbar region: Secondary | ICD-10-CM | POA: Diagnosis not present

## 2018-12-28 DIAGNOSIS — M4646 Discitis, unspecified, lumbar region: Secondary | ICD-10-CM | POA: Diagnosis not present

## 2018-12-28 DIAGNOSIS — N179 Acute kidney failure, unspecified: Secondary | ICD-10-CM | POA: Diagnosis present

## 2018-12-28 DIAGNOSIS — R739 Hyperglycemia, unspecified: Secondary | ICD-10-CM | POA: Diagnosis present

## 2018-12-28 DIAGNOSIS — Z1159 Encounter for screening for other viral diseases: Secondary | ICD-10-CM

## 2018-12-28 LAB — CBC
HCT: 36.2 % — ABNORMAL LOW (ref 39.0–52.0)
Hemoglobin: 12.5 g/dL — ABNORMAL LOW (ref 13.0–17.0)
MCH: 29.7 pg (ref 26.0–34.0)
MCHC: 34.5 g/dL (ref 30.0–36.0)
MCV: 86 fL (ref 80.0–100.0)
Platelets: 429 10*3/uL — ABNORMAL HIGH (ref 150–400)
RBC: 4.21 MIL/uL — ABNORMAL LOW (ref 4.22–5.81)
RDW: 13.7 % (ref 11.5–15.5)
WBC: 19.4 10*3/uL — ABNORMAL HIGH (ref 4.0–10.5)
nRBC: 0 % (ref 0.0–0.2)

## 2018-12-28 LAB — COMPREHENSIVE METABOLIC PANEL
ALT: 27 U/L (ref 0–44)
AST: 23 U/L (ref 15–41)
Albumin: 2.5 g/dL — ABNORMAL LOW (ref 3.5–5.0)
Alkaline Phosphatase: 125 U/L (ref 38–126)
Anion gap: 14 (ref 5–15)
BUN: 17 mg/dL (ref 6–20)
CO2: 22 mmol/L (ref 22–32)
Calcium: 8.3 mg/dL — ABNORMAL LOW (ref 8.9–10.3)
Chloride: 99 mmol/L (ref 98–111)
Creatinine, Ser: 3.05 mg/dL — ABNORMAL HIGH (ref 0.61–1.24)
GFR calc Af Amer: 25 mL/min — ABNORMAL LOW (ref >60)
GFR calc non Af Amer: 22 mL/min — ABNORMAL LOW (ref >60)
Glucose, Bld: 165 mg/dL — ABNORMAL HIGH (ref 70–99)
Potassium: 3.3 mmol/L — ABNORMAL LOW (ref 3.5–5.1)
Sodium: 135 mmol/L (ref 135–145)
Total Bilirubin: 0.7 mg/dL (ref 0.3–1.2)
Total Protein: 6.9 g/dL (ref 6.5–8.1)

## 2018-12-28 MED ORDER — SODIUM CHLORIDE 0.9% FLUSH
3.0000 mL | Freq: Once | INTRAVENOUS | Status: AC
  Administered 2018-12-28: 3 mL via INTRAVENOUS

## 2018-12-28 MED ORDER — SODIUM CHLORIDE 0.9 % IV BOLUS
1000.0000 mL | Freq: Once | INTRAVENOUS | Status: AC
  Administered 2018-12-28: 1000 mL via INTRAVENOUS

## 2018-12-28 NOTE — ED Notes (Signed)
Family came into waiting room to check on patient.  Reports he almost passed out on them and that his picc line is most likely infected.

## 2018-12-28 NOTE — ED Notes (Signed)
ED TO INPATIENT HANDOFF REPORT  ED Nurse Name and Phone #: Suezanne Jacquet [phone]  S Name/Age/Gender Thomas Simpson 58 y.o. male Room/Bed: 019C/019C  Code Status   Code Status: Prior  Home/SNF/Other Home Patient oriented to: self, place, time and situation Is this baseline? Yes   Triage Complete: Triage complete  Chief Complaint Needs fluids  Triage Note Pt sent by PCP for fluids. Pt has a picc line, receiving abx at home. Endorses nausea, denies vomiting.    Allergies No Known Allergies  Level of Care/Admitting Diagnosis ED Disposition    None      B Medical/Surgery History Past Medical History:  Diagnosis Date  . Abscess 11/2018   LUMBAR  . Asthma   . Epidural abscess    L3-4   Past Surgical History:  Procedure Laterality Date  . BACK SURGERY  03/24/2018  . LUMBAR LAMINECTOMY/DECOMPRESSION MICRODISCECTOMY Left 12/13/2018   Procedure: Left Lumbar three-four Laminectomy for epidural abscess;  Surgeon: Kary Kos, MD;  Location: [facility];  Service: Neurosurgery;  Laterality: Left;     A IV Location/Drains/Wounds Patient Lines/Drains/Airways Status   Active Line/Drains/Airways    Name:   Placement date:   Placement time:   Site:   Days:   Peripheral IV 12/28/18 Left;Upper Forearm   12/28/18    2215    Forearm   less than 1   PICC Single Lumen 64/33/29 PICC Right Basilic 43 cm 0 cm   51/88/41    6606    Basilic   14   Incision (Closed) 12/13/18 Back Other (Comment)   12/13/18    1412     15          Intake/Output Last 24 hours No intake or output data in the 24 hours ending 12/28/18 2326  Labs/Imaging Results for orders placed or performed during the hospital encounter of 12/28/18 (from the past 48 hour(s))  Comprehensive metabolic panel     Status: Abnormal   Collection Time: 12/28/18  6:58 PM  Result Value Ref Range   Sodium 135 135 - 145 mmol/L   Potassium 3.3 (L) 3.5 - 5.1 mmol/L   Chloride 99 98 - 111 mmol/L   CO2 22 22 - 32 mmol/L   Glucose, Bld  165 (H) 70 - 99 mg/dL   BUN 17 6 - 20 mg/dL   Creatinine, Ser 3.05 (H) 0.61 - 1.24 mg/dL   Calcium 8.3 (L) 8.9 - 10.3 mg/dL   Total Protein 6.9 6.5 - 8.1 g/dL   Albumin 2.5 (L) 3.5 - 5.0 g/dL   AST 23 15 - 41 U/L   ALT 27 0 - 44 U/L   Alkaline Phosphatase 125 38 - 126 U/L   Total Bilirubin 0.7 0.3 - 1.2 mg/dL   GFR calc non Af Amer 22 (L) >60 mL/min   GFR calc Af Amer 25 (L) >60 mL/min   Anion gap 14 5 - 15    Comment: Performed at [facility], [address], [place], [place] [address]  CBC     Status: Abnormal   Collection Time: 12/28/18  6:58 PM  Result Value Ref Range   WBC 19.4 (H) 4.0 - 10.5 K/uL   RBC 4.21 (L) 4.22 - 5.81 MIL/uL   Hemoglobin 12.5 (L) 13.0 - 17.0 g/dL   HCT 36.2 (L) 39.0 - 52.0 %   MCV 86.0 80.0 - 100.0 fL   MCH 29.7 26.0 - 34.0 pg   MCHC 34.5 30.0 - 36.0 g/dL  RDW 13.7 11.5 - 15.5 %   Platelets 429 (H) 150 - 400 K/uL   nRBC 0.0 0.0 - 0.2 %    Comment: Performed at [facility], [place] [address] [place], [place] [address]   Dg Chest Port 1 View  Result Date: 12/28/2018 CLINICAL DATA:  Fever EXAM: PORTABLE CHEST 1 VIEW COMPARISON:  04/13/2004 FINDINGS: Right upper extremity catheter tip over the SVC. No focal airspace disease or pleural effusion. Normal heart size. No pneumothorax. IMPRESSION: No active disease.  Right upper extremity catheter tip over the SVC Electronically Signed   By: Donavan Foil M.D.   On: 12/28/2018 22:42    Pending Labs Unresulted Labs (From admission, onward)    Start     Ordered   12/28/18 2320  SARS Coronavirus 2 (CEPHEID - Performed in [facility] hospital lab), [facility] Order  (Asymptomatic Patients Labs)  Once,   R    Question:  Rule Out  Answer:  Yes   12/28/18 2319   12/28/18 2201  Blood culture (routine x 2)  BLOOD CULTURE X 2,   STAT     12/28/18 2201   12/28/18 1858  Urinalysis, Routine w reflex microscopic  ONCE - STAT,   STAT     12/28/18 1857          Vitals/Pain Today's Vitals   12/28/18 2215  12/28/18 2217 12/28/18 2300 12/28/18 2315  BP: 117/72  127/82 138/82  Pulse: 93  87 91  Resp: 20  15 (!) 26  Temp:      TempSrc:      SpO2: 97%  98% 98%  PainSc:  8       Isolation Precautions No active isolations  Medications Medications  sodium chloride flush (NS) 0.9 % injection 3 mL (3 mLs Intravenous Given 12/28/18 2217)  sodium chloride 0.9 % bolus 1,000 mL (0 mLs Intravenous Stopped 12/28/18 2326)    Mobility walks Low fall risk   Focused Assessments Ortho    R Recommendations: See Admitting Provider Note  Report given to:   Additional Notes: N/A

## 2018-12-28 NOTE — ED Triage Notes (Signed)
Pt sent by PCP for fluids. Pt has a picc line, receiving abx at home. Endorses nausea, denies vomiting.

## 2018-12-28 NOTE — ED Provider Notes (Signed)
[facility] EMERGENCY DEPARTMENT Provider Note   CSN: [id number] Arrival date & time: 12/28/18  1839    History   Chief Complaint Chief Complaint  Patient presents with  . Dehydration    HPI Thomas Simpson is a 58 y.o. male.     Patient is a 58 year old male with past medical history of lumbar disc disease.  Patient recently diagnosed with lumbar discitis treated with IV antibiotics and surgery by Dr. Saintclair Halsted.  This was performed approximately 3 weeks ago.  Patient was discharged 2 weeks ago with a PICC line in place and has been receiving IV vancomycin.  For the past several days, the patient has been weak and has had no appetite.  He describes decreased p.o. intake.  Home health evaluated him today.  Laboratory studies showed an increase in his renal function and elevation of his white count.  He was then referred here for further treatment.  Patient complains of continued pain in his back.  He does report fever today to 100.8, but is currently afebrile.  The history is provided by the patient.    Past Medical History:  Diagnosis Date  . Abscess 11/2018   LUMBAR  . Asthma   . Epidural abscess    L3-4    Patient Active Problem List   Diagnosis Date Noted  . Diskitis 12/13/2018  . Osteomyelitis ([place]) 12/12/2018    Past Surgical History:  Procedure Laterality Date  . BACK SURGERY  03/24/2018  . LUMBAR LAMINECTOMY/DECOMPRESSION MICRODISCECTOMY Left 12/13/2018   Procedure: Left Lumbar three-four Laminectomy for epidural abscess;  Surgeon: Kary Kos, MD;  Location: [facility];  Service: Neurosurgery;  Laterality: Left;        Home Medications    Prior to Admission medications   Medication Sig Start Date End Date Taking? Authorizing Provider  albuterol (PROAIR HFA) 108 (90 Base) MCG/ACT inhaler Inhale 2 puffs into the lungs every 6 (six) hours as needed for wheezing or shortness of breath.    [provider]  Ascorbic Acid (VITAMIN C GUMMIE PO)  Take 2 tablets by mouth daily after breakfast.    [provider]  cyclobenzaprine (FLEXERIL) 10 MG tablet Take 10 mg by mouth 3 (three) times daily.    [provider]  cyclobenzaprine (FLEXERIL) 10 MG tablet Take 1 tablet (10 mg total) by mouth 3 (three) times daily as needed for muscle spasms. 12/15/18   Meyran, Ocie Cornfield, NP  dexlansoprazole (DEXILANT) 60 MG capsule Take 60 mg by mouth every morning.     [provider]  diclofenac (VOLTAREN) 75 MG EC tablet Take 75 mg by mouth every morning.     [provider]  MegaRed Omega-3 Krill Oil 500 MG CAPS Take 500 mg elemental calcium/kg/hr by mouth daily with breakfast.     [provider]  methylPREDNISolone (MEDROL) 4 MG tablet Take 4 mg by mouth daily.    [provider]  oxyCODONE-acetaminophen (PERCOCET) 5-325 MG tablet Take 1 tablet by mouth every 4 (four) hours as needed for severe pain. 12/15/18 12/15/19  Meyran, Ocie Cornfield, NP  traMADol (ULTRAM) 50 MG tablet Take 50 mg by mouth 3 (three) times daily as needed (for pain).     [provider]  vancomycin IVPB Inject 1,250 mg into the vein every 12 (twelve) hours. Indication:  MRSA osteomyelitis  Last Day of Therapy:  02/06/2019  Labs - Sunday/Monday:  CBC/D, BMP, and vancomycin trough. Labs - Thursday:  BMP and vancomycin trough Labs -  Every other week:  ESR and CRP 12/15/18 02/06/19  Meyran, Ocie Cornfield, NP    Family History No family history on file.  Social History Social History   Tobacco Use  . Smoking status: Current Every Day Smoker    Packs/day: 1.00    Years: 30.00    Pack years: 30.00    Types: Cigarettes  . Smokeless tobacco: Never Used  Substance Use Topics  . Alcohol use: Never    Frequency: Never  . Drug use: Never     Allergies   Patient has no known allergies.   Review of Systems Review of Systems  Constitutional: Positive for appetite change, fatigue and fever.  All other systems  reviewed and are negative.    Physical Exam Updated Vital Signs BP 116/70   Pulse (!) 106   Temp 97.8 F (36.6 C) (Oral)   Resp (!) 23   SpO2 97%   Physical Exam Vitals signs and nursing note reviewed.  Constitutional:      General: He is not in acute distress.    Appearance: He is well-developed. He is not diaphoretic.  HENT:     Head: Normocephalic and atraumatic.  Neck:     Musculoskeletal: Normal range of motion and neck supple.  Cardiovascular:     Rate and Rhythm: Normal rate and regular rhythm.     Heart sounds: No murmur. No friction rub.  Pulmonary:     Effort: Pulmonary effort is normal. No respiratory distress.     Breath sounds: Normal breath sounds. No wheezing or rales.  Abdominal:     General: Bowel sounds are normal. There is no distension.     Palpations: Abdomen is soft.     Tenderness: There is no abdominal tenderness.  Musculoskeletal: Normal range of motion.     Comments: PICC line in the right bicep appears well.  There is mild erythema surrounding the entry site, but no purulent discharge.  Skin:    General: Skin is warm and dry.  Neurological:     Mental Status: He is alert and oriented to person, place, and time.     Coordination: Coordination normal.      ED Treatments / Results  Labs (all labs ordered are listed, but only abnormal results are displayed) Labs Reviewed  COMPREHENSIVE METABOLIC PANEL - Abnormal; Notable for the following components:      Result Value   Potassium 3.3 (*)    Glucose, Bld 165 (*)    Creatinine, Ser 3.05 (*)    Calcium 8.3 (*)    Albumin 2.5 (*)    GFR calc non Af Amer 22 (*)    GFR calc Af Amer 25 (*)    All other components within normal limits  CBC - Abnormal; Notable for the following components:   WBC 19.4 (*)    RBC 4.21 (*)    Hemoglobin 12.5 (*)    HCT 36.2 (*)    Platelets 429 (*)    All other components within normal limits  CULTURE, BLOOD (ROUTINE X 2)  CULTURE, BLOOD (ROUTINE X 2)   URINALYSIS, ROUTINE W REFLEX MICROSCOPIC    EKG EKG Interpretation  Date/Time:  Thursday Dec 28 2018 21:44:57 EDT Ventricular Rate:  106 PR Interval:    QRS Duration: 89 QT Interval:  331 QTC Calculation: 440 R Axis:   74 Text Interpretation:  Sinus tachycardia Atrial premature complex Consider right atrial enlargement RSR' in V1 or V2, probably normal variant Baseline wander in lead(s) V1  Confirmed by Veryl Speak [phone]) on 12/28/2018 9:47:04 PM   Radiology No results found.  Procedures Procedures (including critical care time)  Medications Ordered in ED Medications  sodium chloride flush (NS) 0.9 % injection 3 mL (has no administration in time range)  sodium chloride 0.9 % bolus 1,000 mL (has no administration in time range)     Initial Impression / Assessment and Plan / ED Course  I have reviewed the triage vital signs and the nursing notes.  Pertinent labs & imaging results that were available during my care of the patient were reviewed by me and considered in my medical decision making (see chart for details).  Patient with history of recently diagnosed discitis with surgery by Dr. Saintclair Halsted presenting with complaints of weakness and worsening renal function.  This patient's creatinine has increased from 0.8-3.1 since discharge.  Patient also has low-grade fever at home and white cell count of 19.4.  I feel as though patient will need admission for hydration and IV antibiotics.  I have spoken with Dr. Hal Hope who agrees to admit.  Final Clinical Impressions(s) / ED Diagnoses   Final diagnoses:  None    ED Discharge Orders    None       Veryl Speak, MD 12/28/18 2334

## 2018-12-29 ENCOUNTER — Other Ambulatory Visit: Payer: Self-pay

## 2018-12-29 ENCOUNTER — Encounter (HOSPITAL_COMMUNITY): Payer: Self-pay | Admitting: Internal Medicine

## 2018-12-29 ENCOUNTER — Observation Stay (HOSPITAL_COMMUNITY): Payer: 59

## 2018-12-29 DIAGNOSIS — M869 Osteomyelitis, unspecified: Secondary | ICD-10-CM | POA: Diagnosis not present

## 2018-12-29 DIAGNOSIS — D72823 Leukemoid reaction: Secondary | ICD-10-CM | POA: Diagnosis not present

## 2018-12-29 DIAGNOSIS — G062 Extradural and subdural abscess, unspecified: Secondary | ICD-10-CM

## 2018-12-29 DIAGNOSIS — Z79899 Other long term (current) drug therapy: Secondary | ICD-10-CM | POA: Diagnosis not present

## 2018-12-29 DIAGNOSIS — B9562 Methicillin resistant Staphylococcus aureus infection as the cause of diseases classified elsewhere: Secondary | ICD-10-CM | POA: Diagnosis not present

## 2018-12-29 DIAGNOSIS — T82897A Other specified complication of cardiac prosthetic devices, implants and grafts, initial encounter: Secondary | ICD-10-CM | POA: Diagnosis not present

## 2018-12-29 DIAGNOSIS — R609 Edema, unspecified: Secondary | ICD-10-CM | POA: Diagnosis not present

## 2018-12-29 DIAGNOSIS — M4626 Osteomyelitis of vertebra, lumbar region: Secondary | ICD-10-CM | POA: Diagnosis not present

## 2018-12-29 DIAGNOSIS — M4646 Discitis, unspecified, lumbar region: Secondary | ICD-10-CM | POA: Diagnosis not present

## 2018-12-29 DIAGNOSIS — T368X5A Adverse effect of other systemic antibiotics, initial encounter: Secondary | ICD-10-CM | POA: Diagnosis present

## 2018-12-29 DIAGNOSIS — D649 Anemia, unspecified: Secondary | ICD-10-CM | POA: Diagnosis present

## 2018-12-29 DIAGNOSIS — Z8661 Personal history of infections of the central nervous system: Secondary | ICD-10-CM | POA: Diagnosis not present

## 2018-12-29 DIAGNOSIS — Z79891 Long term (current) use of opiate analgesic: Secondary | ICD-10-CM | POA: Diagnosis not present

## 2018-12-29 DIAGNOSIS — E876 Hypokalemia: Secondary | ICD-10-CM | POA: Diagnosis not present

## 2018-12-29 DIAGNOSIS — N179 Acute kidney failure, unspecified: Principal | ICD-10-CM

## 2018-12-29 DIAGNOSIS — R739 Hyperglycemia, unspecified: Secondary | ICD-10-CM | POA: Diagnosis present

## 2018-12-29 DIAGNOSIS — M4640 Discitis, unspecified, site unspecified: Secondary | ICD-10-CM | POA: Diagnosis not present

## 2018-12-29 DIAGNOSIS — Z1159 Encounter for screening for other viral diseases: Secondary | ICD-10-CM | POA: Diagnosis not present

## 2018-12-29 DIAGNOSIS — J45909 Unspecified asthma, uncomplicated: Secondary | ICD-10-CM | POA: Diagnosis present

## 2018-12-29 DIAGNOSIS — Z95818 Presence of other cardiac implants and grafts: Secondary | ICD-10-CM

## 2018-12-29 DIAGNOSIS — F17210 Nicotine dependence, cigarettes, uncomplicated: Secondary | ICD-10-CM | POA: Diagnosis present

## 2018-12-29 DIAGNOSIS — G061 Intraspinal abscess and granuloma: Secondary | ICD-10-CM | POA: Diagnosis present

## 2018-12-29 DIAGNOSIS — E860 Dehydration: Secondary | ICD-10-CM | POA: Diagnosis present

## 2018-12-29 LAB — CBC WITH DIFFERENTIAL/PLATELET
Abs Immature Granulocytes: 0.19 10*3/uL — ABNORMAL HIGH (ref 0.00–0.07)
Basophils Absolute: 0.1 10*3/uL (ref 0.0–0.1)
Basophils Relative: 0 %
Eosinophils Absolute: 0.1 10*3/uL (ref 0.0–0.5)
Eosinophils Relative: 0 %
HCT: 31.6 % — ABNORMAL LOW (ref 39.0–52.0)
Hemoglobin: 10.6 g/dL — ABNORMAL LOW (ref 13.0–17.0)
Immature Granulocytes: 1 %
Lymphocytes Relative: 8 %
Lymphs Abs: 1.6 10*3/uL (ref 0.7–4.0)
MCH: 29.2 pg (ref 26.0–34.0)
MCHC: 33.5 g/dL (ref 30.0–36.0)
MCV: 87.1 fL (ref 80.0–100.0)
Monocytes Absolute: 1.6 10*3/uL — ABNORMAL HIGH (ref 0.1–1.0)
Monocytes Relative: 8 %
Neutro Abs: 15.3 10*3/uL — ABNORMAL HIGH (ref 1.7–7.7)
Neutrophils Relative %: 83 %
Platelets: 371 10*3/uL (ref 150–400)
RBC: 3.63 MIL/uL — ABNORMAL LOW (ref 4.22–5.81)
RDW: 13.8 % (ref 11.5–15.5)
WBC: 18.7 10*3/uL — ABNORMAL HIGH (ref 4.0–10.5)
nRBC: 0 % (ref 0.0–0.2)

## 2018-12-29 LAB — URINALYSIS, ROUTINE W REFLEX MICROSCOPIC
Bilirubin Urine: NEGATIVE
Glucose, UA: NEGATIVE mg/dL
Ketones, ur: NEGATIVE mg/dL
Leukocytes,Ua: NEGATIVE
Nitrite: NEGATIVE
Protein, ur: NEGATIVE mg/dL
Specific Gravity, Urine: 1.003 — ABNORMAL LOW (ref 1.005–1.030)
pH: 6 (ref 5.0–8.0)

## 2018-12-29 LAB — BASIC METABOLIC PANEL
Anion gap: 12 (ref 5–15)
BUN: 18 mg/dL (ref 6–20)
CO2: 23 mmol/L (ref 22–32)
Calcium: 8.1 mg/dL — ABNORMAL LOW (ref 8.9–10.3)
Chloride: 107 mmol/L (ref 98–111)
Creatinine, Ser: 3.04 mg/dL — ABNORMAL HIGH (ref 0.61–1.24)
GFR calc Af Amer: 25 mL/min — ABNORMAL LOW (ref >60)
GFR calc non Af Amer: 22 mL/min — ABNORMAL LOW (ref >60)
Glucose, Bld: 145 mg/dL — ABNORMAL HIGH (ref 70–99)
Potassium: 3.4 mmol/L — ABNORMAL LOW (ref 3.5–5.1)
Sodium: 142 mmol/L (ref 135–145)

## 2018-12-29 LAB — HEPATIC FUNCTION PANEL
ALT: 24 U/L (ref 0–44)
AST: 18 U/L (ref 15–41)
Albumin: 2.1 g/dL — ABNORMAL LOW (ref 3.5–5.0)
Alkaline Phosphatase: 106 U/L (ref 38–126)
Bilirubin, Direct: 0.1 mg/dL (ref 0.0–0.2)
Indirect Bilirubin: 0.4 mg/dL (ref 0.3–0.9)
Total Bilirubin: 0.5 mg/dL (ref 0.3–1.2)
Total Protein: 5.9 g/dL — ABNORMAL LOW (ref 6.5–8.1)

## 2018-12-29 LAB — C-REACTIVE PROTEIN: CRP: 17.7 mg/dL — ABNORMAL HIGH (ref <1.0)

## 2018-12-29 LAB — SODIUM, URINE, RANDOM: Sodium, Ur: 40 mmol/L

## 2018-12-29 LAB — HEMOGLOBIN A1C
Hgb A1c MFr Bld: 6 % — ABNORMAL HIGH (ref 4.8–5.6)
Mean Plasma Glucose: 125.5 mg/dL

## 2018-12-29 LAB — CREATININE, URINE, RANDOM: Creatinine, Urine: 26.13 mg/dL

## 2018-12-29 LAB — VANCOMYCIN, TROUGH: Vancomycin Tr: 58 ug/mL (ref 15–20)

## 2018-12-29 LAB — SEDIMENTATION RATE: Sed Rate: 77 mm/hr — ABNORMAL HIGH (ref 0–16)

## 2018-12-29 LAB — HIV ANTIBODY (ROUTINE TESTING W REFLEX): HIV Screen 4th Generation wRfx: NONREACTIVE

## 2018-12-29 LAB — SARS CORONAVIRUS 2 BY RT PCR (HOSPITAL ORDER, PERFORMED IN ~~LOC~~ HOSPITAL LAB): SARS Coronavirus 2: NEGATIVE

## 2018-12-29 MED ORDER — ENOXAPARIN SODIUM 30 MG/0.3ML ~~LOC~~ SOLN
30.0000 mg | SUBCUTANEOUS | Status: DC
  Administered 2018-12-29 – 2019-01-02 (×5): 30 mg via SUBCUTANEOUS
  Filled 2018-12-29 (×5): qty 0.3

## 2018-12-29 MED ORDER — ACETAMINOPHEN 325 MG PO TABS: 650.0000 mg | ORAL_TABLET | Freq: Four times a day (QID) | ORAL | Status: DC | PRN

## 2018-12-29 MED ORDER — OXYCODONE-ACETAMINOPHEN 5-325 MG PO TABS: 1.0000 | ORAL_TABLET | ORAL | Status: DC | PRN

## 2018-12-29 MED ORDER — METHYLPREDNISOLONE 4 MG PO TABS: 4.0000 mg | ORAL_TABLET | Freq: Every day | ORAL | Status: DC

## 2018-12-29 MED ORDER — SODIUM CHLORIDE 0.9% FLUSH
10.0000 mL | Freq: Two times a day (BID) | INTRAVENOUS | Status: DC
  Administered 2018-12-30 – 2019-01-03 (×3): 10 mL

## 2018-12-29 MED ORDER — ACETAMINOPHEN 650 MG RE SUPP: 650.0000 mg | Freq: Four times a day (QID) | RECTAL | Status: DC | PRN

## 2018-12-29 MED ORDER — ONDANSETRON HCL 4 MG PO TABS: 4.0000 mg | ORAL_TABLET | Freq: Four times a day (QID) | ORAL | Status: DC | PRN

## 2018-12-29 MED ORDER — SODIUM CHLORIDE 0.9 % IV BOLUS
1000.0000 mL | Freq: Once | INTRAVENOUS | Status: AC
  Administered 2018-12-29: 1000 mL via INTRAVENOUS

## 2018-12-29 MED ORDER — SODIUM CHLORIDE 0.9 % IV SOLN
INTRAVENOUS | Status: DC
  Administered 2018-12-29 (×2): via INTRAVENOUS

## 2018-12-29 MED ORDER — ADULT MULTIVITAMIN W/MINERALS CH
1.0000 | ORAL_TABLET | Freq: Every day | ORAL | Status: DC
  Administered 2018-12-29 – 2019-01-04 (×7): 1 via ORAL
  Filled 2018-12-29 (×7): qty 1

## 2018-12-29 MED ORDER — ONDANSETRON HCL 4 MG/2ML IJ SOLN: 4.0000 mg | Freq: Four times a day (QID) | INTRAMUSCULAR | Status: DC | PRN

## 2018-12-29 MED ORDER — TRAMADOL HCL 50 MG PO TABS: 50.0000 mg | ORAL_TABLET | Freq: Three times a day (TID) | ORAL | Status: DC | PRN

## 2018-12-29 MED ORDER — SODIUM CHLORIDE 0.9 % IV SOLN
INTRAVENOUS | Status: DC
  Administered 2018-12-29 – 2019-01-04 (×10): via INTRAVENOUS

## 2018-12-29 MED ORDER — CYCLOBENZAPRINE HCL 10 MG PO TABS
10.0000 mg | ORAL_TABLET | Freq: Three times a day (TID) | ORAL | Status: DC | PRN
  Administered 2018-12-29 – 2018-12-30 (×2): 10 mg via ORAL
  Filled 2018-12-29 (×3): qty 1

## 2018-12-29 MED ORDER — PANTOPRAZOLE SODIUM 40 MG PO TBEC
40.0000 mg | DELAYED_RELEASE_TABLET | Freq: Every day | ORAL | Status: DC
  Administered 2018-12-29 – 2019-01-04 (×7): 40 mg via ORAL
  Filled 2018-12-29 (×7): qty 1

## 2018-12-29 MED ORDER — ENSURE ENLIVE PO LIQD
237.0000 mL | Freq: Two times a day (BID) | ORAL | Status: DC
  Administered 2018-12-29 – 2019-01-04 (×11): 237 mL via ORAL

## 2018-12-29 MED ORDER — ALTEPLASE 2 MG IJ SOLR
2.0000 mg | Freq: Once | INTRAMUSCULAR | Status: AC
  Administered 2018-12-29: 2 mg
  Filled 2018-12-29 (×2): qty 2

## 2018-12-29 MED ORDER — VITAMIN C 500 MG PO TABS
500.0000 mg | ORAL_TABLET | Freq: Every day | ORAL | Status: DC
  Administered 2018-12-29 – 2019-01-04 (×7): 500 mg via ORAL
  Filled 2018-12-29 (×7): qty 1

## 2018-12-29 MED ORDER — ALBUTEROL SULFATE (2.5 MG/3ML) 0.083% IN NEBU: 3.0000 mL | INHALATION_SOLUTION | Freq: Four times a day (QID) | RESPIRATORY_TRACT | Status: DC | PRN

## 2018-12-29 NOTE — Progress Notes (Signed)
RUE venous duplex       has been completed. Preliminary results can be found under CV proc through chart review. Graceson Nichelson, BS, RDMS, RVT   

## 2018-12-29 NOTE — Progress Notes (Signed)
Pharmacy Antibiotic Note  Thomas Simpson is a 58 y.o. male admitted on 12/28/2018 with AKI in the setting of supratherapeutic vancomycin levels. 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 SCr on admit 3 mg/dl (baseline ~0.8 mg/dl), vancomycin random overnight was 58 mcg/ml. Pharmacy asked to transition pt to daptomycin.  Given that vancomycin is currently supratherapeutic and continuing to cover the MRSA infection, will hold off on daptomycin initiation for now until vancomycin clears.  Plan: -No further vancomycin -Check vancomycin random in the morning to assess clearance -Begin daptomycin once vancomycin is clearing   Height: 5\' 10"  (177.8 cm) Weight: 171 lb 15.3 oz (78 kg) IBW/kg (Calculated) : 73  Temp (24hrs), Avg:98.2 F (36.8 C), Min:97.8 F (36.6 C), Max:98.5 F (36.9 C)  Recent Labs  Lab 12/28/18 1858 12/28/18 2354 12/29/18 0255 12/29/18 0830  WBC 19.4*  --  18.7*  --   CREATININE 3.05*  --   --  3.04*  VANCOTROUGH  --  58*  --   --     Estimated Creatinine Clearance: 28 mL/min (A) (by C-G formula based on SCr of 3.04 mg/dL (H)).    No Known Allergies  Antimicrobials this admission: Vancomycin 4/22 >> (6/16)  Microbiology results: 5/7 BCx: NGTD 4/22 Lumbar wound: MRSA 4/22 Disc Space: MRSA 4/21 BCx: negative  Thank you for allowing pharmacy to be a part of this patient's care.   Arrie Senate, PharmD, BCPS Clinical Pharmacist [phone] Please check AMION for all [place] numbers 12/29/2018

## 2018-12-29 NOTE — H&P (Signed)
History and Physical    NAYAN PROCH TIR:[id number] DOB: October 09, 1960 DOA: 12/28/2018  PCP: [facility]  Patient coming from: Home.  Chief Complaint: Weakness and abnormal labs.  HPI: Thomas Simpson is a 58 y.o. male with history of recent surgery for L3-L4 discitis epidural abscess was discharged home 2 weeks ago after surgery on vancomycin which is to be taken up to February 06, 2019 per the discharge summary has not been feeling well last 4 to 5 days with increasing weakness poor appetite but denies any nausea vomiting or diarrhea.  Denies chest pain or shortness of breath.  Patient states he felt febrile temperatures running at 100.8 last few days.  Family also noticed some pinkish discoloration around the right upper extremity PICC line.  Home home health and taking his labs which showed worsening creatinine and increasing leukocytosis and was referred to the ER.  Patient's low back did hurt more than usual while waiting in the ER on a wheelchair.  Which improved by the time patient lied on the bed.  ED Course: In the ER patient was afebrile and labs show creatinine of 3 which is increased from normal of 0.72 weeks ago.  WBC count was 19.4 hemoglobin 12.5 platelets 429.  Patient's COVID 19 test was negative.  UA is negative for nitrites leukocytes no casts.  Patient was started on IV fluids admitted for acute renal failure.  Patient's vancomycin trough was 58.  Review of Systems: As per HPI, rest all negative.   Past Medical History:  Diagnosis Date  . Abscess 11/2018   LUMBAR  . Asthma   . Epidural abscess    L3-4    Past Surgical History:  Procedure Laterality Date  . BACK SURGERY  03/24/2018  . LUMBAR LAMINECTOMY/DECOMPRESSION MICRODISCECTOMY Left 12/13/2018   Procedure: Left Lumbar three-four Laminectomy for epidural abscess;  Surgeon: Kary Kos, MD;  Location: [facility];  Service: Neurosurgery;  Laterality: Left;     reports that he has been smoking  cigarettes. He has a 30.00 pack-year smoking history. He has never used smokeless tobacco. He reports that he does not drink alcohol or use drugs.  No Known Allergies  History reviewed. No pertinent family history.  Prior to Admission medications   Medication Sig Start Date End Date Taking? Authorizing Provider  albuterol (PROAIR HFA) 108 (90 Base) MCG/ACT inhaler Inhale 2 puffs into the lungs every 6 (six) hours as needed for wheezing or shortness of breath.    [provider]  Ascorbic Acid (VITAMIN C GUMMIE PO) Take 2 tablets by mouth daily after breakfast.    [provider]  cyclobenzaprine (FLEXERIL) 10 MG tablet Take 10 mg by mouth 3 (three) times daily.    [provider]  cyclobenzaprine (FLEXERIL) 10 MG tablet Take 1 tablet (10 mg total) by mouth 3 (three) times daily as needed for muscle spasms. 12/15/18   Meyran, Ocie Cornfield, NP  dexlansoprazole (DEXILANT) 60 MG capsule Take 60 mg by mouth every morning.     [provider]  diclofenac (VOLTAREN) 75 MG EC tablet Take 75 mg by mouth every morning.     [provider]  MegaRed Omega-3 Krill Oil 500 MG CAPS Take 500 mg elemental calcium/kg/hr by mouth daily with breakfast.     [provider]  methylPREDNISolone (MEDROL) 4 MG tablet Take 4 mg by mouth daily.    [provider]  oxyCODONE-acetaminophen (PERCOCET) 5-325 MG tablet Take 1 tablet by mouth every 4 (four)  hours as needed for severe pain. 12/15/18 12/15/19  Meyran, Ocie Cornfield, NP  traMADol (ULTRAM) 50 MG tablet Take 50 mg by mouth 3 (three) times daily as needed (for pain).     [provider]  vancomycin IVPB Inject 1,250 mg into the vein every 12 (twelve) hours. Indication:  MRSA osteomyelitis  Last Day of Therapy:  02/06/2019  Labs - Sunday/Monday:  CBC/D, BMP, and vancomycin trough. Labs - Thursday:  BMP and vancomycin trough Labs - Every other week:  ESR and CRP 12/15/18 02/06/19  Meyran, Ocie Cornfield, NP    Physical Exam: Vitals:   12/28/18 2345 12/29/18 0000 12/29/18 0015 12/29/18 0030  BP: 120/81 126/89 140/87 (!) 147/93  Pulse: 85 85 81 87  Resp: 15 14 12  (!) 22  Temp:      TempSrc:      SpO2: 99% 99% 98% 98%      Constitutional: Moderately built and nourished. Vitals:   12/28/18 2345 12/29/18 0000 12/29/18 0015 12/29/18 0030  BP: 120/81 126/89 140/87 (!) 147/93  Pulse: 85 85 81 87  Resp: 15 14 12  (!) 22  Temp:      TempSrc:      SpO2: 99% 99% 98% 98%   Eyes: Anicteric no pallor. ENMT: No discharge from the ears eyes nose and mouth. Neck: No mass felt.  No neck rigidity. Respiratory: No rhonchi or crepitations. Cardiovascular: S1-S2 heard. Abdomen: Soft nontender bowel sounds present. Musculoskeletal: No edema. Skin: Slight discoloration on the right upper extremity around the PICC line. Neurologic: Alert awake oriented to time place and person.  Moves all extremities. Psychiatric: Appears normal per normal affect.   Labs on Admission: I have personally reviewed following labs and imaging studies  CBC: Recent Labs  Lab 12/28/18 1858  WBC 19.4*  HGB 12.5*  HCT 36.2*  MCV 86.0  PLT 544*   Basic Metabolic Panel: Recent Labs  Lab 12/28/18 1858  NA 135  K 3.3*  CL 99  CO2 22  GLUCOSE 165*  BUN 17  CREATININE 3.05*  CALCIUM 8.3*   GFR: CrCl cannot be calculated (Unknown ideal weight.). Liver Function Tests: Recent Labs  Lab 12/28/18 1858  AST 23  ALT 27  ALKPHOS 125  BILITOT 0.7  PROT 6.9  ALBUMIN 2.5*   No results for input(s): LIPASE, AMYLASE in the last 168 hours. No results for input(s): AMMONIA in the last 168 hours. Coagulation Profile: No results for input(s): INR, PROTIME in the last 168 hours. Cardiac Enzymes: No results for input(s): CKTOTAL, CKMB, CKMBINDEX, TROPONINI in the last 168 hours. BNP (last 3 results) No results for input(s): PROBNP in the last 8760 hours. HbA1C: No results for input(s): HGBA1C in the  last 72 hours. CBG: No results for input(s): GLUCAP in the last 168 hours. Lipid Profile: No results for input(s): CHOL, HDL, LDLCALC, TRIG, CHOLHDL, LDLDIRECT in the last 72 hours. Thyroid Function Tests: No results for input(s): TSH, T4TOTAL, FREET4, T3FREE, THYROIDAB in the last 72 hours. Anemia Panel: No results for input(s): VITAMINB12, FOLATE, FERRITIN, TIBC, IRON, RETICCTPCT in the last 72 hours. Urine analysis:    Component Value Date/Time   COLORURINE STRAW (A) 12/28/2018 2354   APPEARANCEUR CLEAR 12/28/2018 2354   LABSPEC 1.003 (L) 12/28/2018 2354   PHURINE 6.0 12/28/2018 2354   GLUCOSEU NEGATIVE 12/28/2018 2354   HGBUR SMALL (A) 12/28/2018 2354   BILIRUBINUR NEGATIVE 12/28/2018 2354   KETONESUR NEGATIVE 12/28/2018 2354   PROTEINUR NEGATIVE 12/28/2018 2354   NITRITE NEGATIVE  12/28/2018 2354   LEUKOCYTESUR NEGATIVE 12/28/2018 2354   Sepsis Labs: '@LABRCNTIP'$ (procalcitonin:4,lacticidven:4) ) Recent Results (from the past 240 hour(s))  SARS Coronavirus 2 (CEPHEID - Performed in [facility] hospital lab), Hosp Order     Status: None   Collection Time: 12/28/18 11:20 PM  Result Value Ref Range Status   SARS Coronavirus 2 NEGATIVE NEGATIVE Final    Comment: (NOTE) If result is NEGATIVE SARS-CoV-2 target nucleic acids are NOT DETECTED. The SARS-CoV-2 RNA is generally detectable in upper and lower  respiratory specimens during the acute phase of infection. The lowest  concentration of SARS-CoV-2 viral copies this assay can detect is 250  copies / mL. A negative result does not preclude SARS-CoV-2 infection  and should not be used as the sole basis for treatment or other  patient management decisions.  A negative result may occur with  improper specimen collection / handling, submission of specimen other  than nasopharyngeal swab, presence of viral mutation(s) within the  areas targeted by this assay, and inadequate number of viral copies  (<250 copies / mL). A negative  result must be combined with clinical  observations, patient history, and epidemiological information. If result is POSITIVE SARS-CoV-2 target nucleic acids are DETECTED. The SARS-CoV-2 RNA is generally detectable in upper and lower  respiratory specimens dur ing the acute phase of infection.  Positive  results are indicative of active infection with SARS-CoV-2.  Clinical  correlation with patient history and other diagnostic information is  necessary to determine patient infection status.  Positive results do  not rule out bacterial infection or co-infection with other viruses. If result is PRESUMPTIVE POSTIVE SARS-CoV-2 nucleic acids MAY BE PRESENT.   A presumptive positive result was obtained on the submitted specimen  and confirmed on repeat testing.  While 2019 novel coronavirus  (SARS-CoV-2) nucleic acids may be present in the submitted sample  additional confirmatory testing may be necessary for epidemiological  and / or clinical management purposes  to differentiate between  SARS-CoV-2 and other Sarbecovirus currently known to infect humans.  If clinically indicated additional testing with an alternate test  methodology [phone]) is advised. The SARS-CoV-2 RNA is generally  detectable in upper and lower respiratory sp ecimens during the acute  phase of infection. The expected result is Negative. Fact Sheet for Patients:  StrictlyIdeas.no Fact Sheet for Healthcare Providers: BankingDealers.co.za This test is not yet approved or cleared by the [place] FDA and has been authorized for detection and/or diagnosis of SARS-CoV-2 by FDA under an Emergency Use Authorization (EUA).  This EUA will remain in effect (meaning this test can be used) for the duration of the COVID-19 declaration under Section 564(b)(1) of the Act, 21 U.S.C. section 360bbb-3(b)(1), unless the authorization is terminated or revoked sooner. Performed at [facility], [place] [address] [place], [place] [address]      Radiological Exams on Admission: Dg Chest Port 1 View  Result Date: 12/28/2018 CLINICAL DATA:  Fever EXAM: PORTABLE CHEST 1 VIEW COMPARISON:  04/13/2004 FINDINGS: Right upper extremity catheter tip over the SVC. No focal airspace disease or pleural effusion. Normal heart size. No pneumothorax. IMPRESSION: No active disease.  Right upper extremity catheter tip over the SVC Electronically Signed   By: Donavan Foil M.D.   On: 12/28/2018 22:42     Assessment/Plan Principal Problem:   ARF (acute renal failure) (HCC) Active Problems:   Diskitis   Normochromic normocytic anemia    1. Acute renal failure -could be prerenal from  poor oral intake recently.  Patient's medication list also shows that patient is on diclofenac which could have contributed.  Patient is also on vancomycin trough levels of 58.  Will hydrate patient check FENa and may discuss with infectious disease/nephrology for continuing vancomycin given the acute renal failure. 2. Recent surgery for discitis involving L3-L4 with epidural abscess with wound culture growing MRSA.  Patient was discharged on vancomycin.  Given the acute renal failure and elevated trough levels discussed with pharmacy at this time patient will not need another dose of vancomycin at least for 2 days.  Will discuss with nephrology or infectious disease.  Continue vancomycin given the acute renal failure.  We will also look for alternative.  Patient did have increasing low back pain while waiting in the ER which improved after patient came to the floor and was lying on the bed.  There is worsening pain again may have to consider repeat MRI. 3. Normocytic normochromic anemia follow CBC. 4. Hyperglycemia -patient recently took methylprednisone which could be contributing.  Presently off methylprednisone check hemoglobin A1c. 5. Tobacco abuse -advised about quitting smoking. 6. Mild discoloration around  the right upper extremity PICC line for which I have ordered Dopplers.  Follow blood cultures.   DVT prophylaxis: Lovenox. Code Status: Full code. Family Communication: Discussed with patient. Disposition Plan: Home. Consults called: None. Admission status: Observation.   Rise Patience MD [facility] Pager [phone].  If 7PM-7AM, please contact night-coverage [email or web address] Password [email or web address]  12/29/2018, 1:18 AM

## 2018-12-29 NOTE — Progress Notes (Signed)
[facility] for Infectious Disease    Date of Admission:  12/28/2018     Total days of antibiotics 16               Reason for Consult: MRSA discitis and abscess   Referring Provider: Eliseo Squires / [facility] Primary Care Provider: [facility]   Assessment/Plan:  Thomas Simpson is a 58 y/o male smoker who underwent surgical intervention for lumbar epidural abscess and discitis/osteomyelitis of L3-4 on 4/22 discharged home with vancomycin for MRSA infection with end date of 6/16 who is readmitted with acute renal failure and mild discoloration around his PICC line. Vancomycin levels through home health were found to be as high as 74.  Acute renal failure - Creatinine initially at discharge normal at 0.72 and now around 3. This has been going on for about 1 week since levels were initially found to be elevated by Home Health and likely related to vancomycin nephrotoxicity as his fluid and electrolyte status appear to be normal. Will discontinue vancomycin and change to Daptomycin. Will check with home health regarding covered antibiotic options. Appreciate assistance in management from primary team.   Lumbar epidural abscess and Discitis/osteomyelitis of L3-4 - MRSA infection and will need continued treatment through 02/06/19 per initial plan.   Discoloration around PICC line - Upper extremity dopplers without evidence of DVT. No induration or drainage pr IV team flushing with tPA. Continue to monitor.    Principal Problem:   ARF (acute renal failure) (HCC) Active Problems:   Diskitis   Normochromic normocytic anemia    enoxaparin (LOVENOX) injection  30 mg Subcutaneous Q24H   pantoprazole  40 mg Oral Daily   sodium chloride flush  10-40 mL Intracatheter Q12H   vitamin C  500 mg Oral QPC breakfast     HPI: Thomas Simpson is a 58 y.o. male with history of tobacco usage who was recently admitted to the hospital on 12/12/18 with outpatient MRI showing a lumbar  epidural abscess of L4-L5 and discitis and possible osteomyelitis. He underwent decompressive lumbar laminectomy L3-4 and extending up to the inferior aspect of the 2 3 to space with foraminotomies of the L3 and L4 nerve roots on the left and microscopic discectomy with microscopic dissection of the L3 and L4 nerve roots. Cultures were positive for MRSA. He was discharged to home with plans for 8 weeks of vancomycin through 02/06/19.  Thomas Simpson was readmitted on 12/29/18 with malaise and weak for about 4-5 days. Febrile in the days prior to admission with a temp of 100.8 with pinkish discoloration around the right upper extremity PICC line. Home Health labs reported worsening creatinine levels and increasing leukocytosis. In the ED he was afebrile with stable vital signs and creatinine was 3 from baseline of 0.72. WBC count was elevated at 19.4. Vancomycin trough was noted to be 58. Upper extremity dopplers were without evidence of DVT. PICC was without blood return and now receiving tPA.   Thomas Simpson has remained afebrile since admission and now with leukocytosis of 18.7. Blood cultures drawn are without growth in <12 hours. Blood work from [facility] reviewed with Vacncomycin trough on 4/28 at 12 and creatinine of 0.63, then 5/1 at 31.6 and creatinine of 2.52, then 5/5 at 74.5 with creatinine of 3.43, and on 5/7/ with 64.5 with creatinine of 2.97.    Review of Systems: Review of Systems  Constitutional: Positive for fever. Negative for chills and weight loss.  Respiratory: Negative for  cough, shortness of breath and wheezing.   Cardiovascular: Negative for chest pain and leg swelling.  Gastrointestinal: Negative for abdominal pain, constipation, diarrhea, nausea and vomiting.  Musculoskeletal: Positive for back pain.  Skin: Negative for rash.     Past Medical History:  Diagnosis Date   Abscess 11/2018   LUMBAR   Asthma    Epidural abscess    L3-4    Social History   Tobacco Use     Smoking status: Current Every Day Smoker    Packs/day: 1.00    Years: 30.00    Pack years: 30.00    Types: Cigarettes   Smokeless tobacco: Never Used  Substance Use Topics   Alcohol use: Never    Frequency: Never   Drug use: Never    History reviewed. No pertinent family history.  No Known Allergies  OBJECTIVE: Blood pressure 140/69, pulse 82, temperature 98 F (36.7 C), temperature source Oral, resp. rate 18, height 5\' 10"  (1.778 m), weight 78 kg, SpO2 96 %.  Physical Exam Constitutional:      General: He is not in acute distress.    Appearance: He is well-developed.  Cardiovascular:     Rate and Rhythm: Normal rate and regular rhythm.     Heart sounds: Normal heart sounds.     Comments: PICC line dressing is clean and dry. Biopatch around insertion site. Possible skin irritation proximal to biopatch.  Pulmonary:     Effort: Pulmonary effort is normal.     Breath sounds: Normal breath sounds.  Skin:    General: Skin is warm and dry.  Neurological:     Mental Status: He is alert and oriented to person, place, and time.  Psychiatric:        Behavior: Behavior normal.        Thought Content: Thought content normal.        Judgment: Judgment normal.     Lab Results Lab Results  Component Value Date   WBC 18.7 (H) 12/29/2018   HGB 10.6 (L) 12/29/2018   HCT 31.6 (L) 12/29/2018   MCV 87.1 12/29/2018   PLT 371 12/29/2018    Lab Results  Component Value Date   CREATININE 3.04 (H) 12/29/2018   BUN 18 12/29/2018   NA 142 12/29/2018   K 3.4 (L) 12/29/2018   CL 107 12/29/2018   CO2 23 12/29/2018    Lab Results  Component Value Date   ALT 24 12/29/2018   AST 18 12/29/2018   ALKPHOS 106 12/29/2018   BILITOT 0.5 12/29/2018     Microbiology: Recent Results (from the past 240 hour(s))  Blood culture (routine x 2)     Status: None (Preliminary result)   Collection Time: 12/28/18 10:15 PM  Result Value Ref Range Status   Specimen Description BLOOD LEFT ARM   Final   Special Requests   Final    BOTTLES DRAWN AEROBIC AND ANAEROBIC Blood Culture adequate volume   Culture   Final    NO GROWTH < 12 HOURS Performed at [facility], [address] [place], [place] [address]    Report Status PENDING  Incomplete  Blood culture (routine x 2)     Status: None (Preliminary result)   Collection Time: 12/28/18 10:18 PM  Result Value Ref Range Status   Specimen Description BLOOD RIGHT FOREARM  Final   Special Requests   Final    BOTTLES DRAWN AEROBIC AND ANAEROBIC Blood Culture adequate volume   Culture   Final  NO GROWTH < 12 HOURS Performed at [facility] [address], [place], [place] [address]    Report Status PENDING  Incomplete  SARS Coronavirus 2 (CEPHEID - Performed in [facility] hospital lab), Hosp Order     Status: None   Collection Time: 12/28/18 11:20 PM  Result Value Ref Range Status   SARS Coronavirus 2 NEGATIVE NEGATIVE Final    Comment: (NOTE) If result is NEGATIVE SARS-CoV-2 target nucleic acids are NOT DETECTED. The SARS-CoV-2 RNA is generally detectable in upper and lower  respiratory specimens during the acute phase of infection. The lowest  concentration of SARS-CoV-2 viral copies this assay can detect is 250  copies / mL. A negative result does not preclude SARS-CoV-2 infection  and should not be used as the sole basis for treatment or other  patient management decisions.  A negative result may occur with  improper specimen collection / handling, submission of specimen other  than nasopharyngeal swab, presence of viral mutation(s) within the  areas targeted by this assay, and inadequate number of viral copies  (<250 copies / mL). A negative result must be combined with clinical  observations, patient history, and epidemiological information. If result is POSITIVE SARS-CoV-2 target nucleic acids are DETECTED. The SARS-CoV-2 RNA is generally detectable in upper and lower  respiratory specimens dur ing  the acute phase of infection.  Positive  results are indicative of active infection with SARS-CoV-2.  Clinical  correlation with patient history and other diagnostic information is  necessary to determine patient infection status.  Positive results do  not rule out bacterial infection or co-infection with other viruses. If result is PRESUMPTIVE POSTIVE SARS-CoV-2 nucleic acids MAY BE PRESENT.   A presumptive positive result was obtained on the submitted specimen  and confirmed on repeat testing.  While 2019 novel coronavirus  (SARS-CoV-2) nucleic acids may be present in the submitted sample  additional confirmatory testing may be necessary for epidemiological  and / or clinical management purposes  to differentiate between  SARS-CoV-2 and other Sarbecovirus currently known to infect humans.  If clinically indicated additional testing with an alternate test  methodology [phone]) is advised. The SARS-CoV-2 RNA is generally  detectable in upper and lower respiratory sp ecimens during the acute  phase of infection. The expected result is Negative. Fact Sheet for Patients:  StrictlyIdeas.no Fact Sheet for Healthcare Providers: BankingDealers.co.za This test is not yet approved or cleared by the [place] FDA and has been authorized for detection and/or diagnosis of SARS-CoV-2 by FDA under an Emergency Use Authorization (EUA).  This EUA will remain in effect (meaning this test can be used) for the duration of the COVID-19 declaration under Section 564(b)(1) of the Act, 21 U.S.C. section 360bbb-3(b)(1), unless the authorization is terminated or revoked sooner. Performed at [facility], [place] [address], [place], [place] [address]      Terri Piedra, [place] [facility] [phone] Pager  12/29/2018  11:34 AM

## 2018-12-29 NOTE — Progress Notes (Signed)
RA SL PICC without blood return.  Pt currently receiving continuous fluids. Fluids infusing through LA PIV while TPA dwells in PICC.

## 2018-12-29 NOTE — Progress Notes (Signed)
Initial Nutrition Assessment  RD working remotely.  DOCUMENTATION CODES:   Not applicable  INTERVENTION:   - MVI with minerals daily  - Ensure Enlive po BID, each supplement provides 350 kcal and 20 grams of protein  NUTRITION DIAGNOSIS:   Increased nutrient needs related to acute illness (acute renal failure) as evidenced by estimated needs.  GOAL:   Patient will meet greater than or equal to 90% of their needs  MONITOR:   PO intake, Labs, Supplement acceptance, Skin, Weight trends, I & O's  REASON FOR ASSESSMENT:   Malnutrition Screening Tool    ASSESSMENT:   58 year old male who presented to the ED on 5/07 with dehydration. Pt underwent surgical intervention for lumbar epidural abscess and discitis/osteomyelitis of L3-4 on 4/22. Pt discharged home with vancomycin for MRSA infection with end date of 6/16 and is now readmitted with acute renal failure and mild discoloration around his PICC line.  Attempted to speak with pt via phone call to room but pt did not answer.  Per chart review, pt has been experiencing poor appetite since discharge.  Reviewed weight history in chart. Noted pt with 21.8 kg weight loss over the last 11 months. This is a 21.8% weight loss which is significant for timeframe. Given significant weight loss, pt is at risk for malnutrition. Unable to diagnose at this time without diet history and completion of NFPE.  Wt Readings from Last 5 Encounters:  12/29/18 78 kg  12/13/18 79.2 kg  01/26/18 99.8 kg   RD will provide an oral nutrition supplement to aid pt in meeting kcal and protein needs and promote healing. Will also order daily MVI.  Meal Completion: 50% x 1 meal  Medications reviewed and includee: Protonix, vitamin C 500 mg daily IVF: NS @ 75 ml/hr  Labs reviewed: potassium 3.4 (L), creatinine 3.04 (H)  UOP: 975 ml x 24 hours  NUTRITION - FOCUSED PHYSICAL EXAM:  Unable to complete. RD working remotely.  Diet Order:   Diet Order             Diet regular Room service appropriate? Yes; Fluid consistency: Thin  Diet effective now              EDUCATION NEEDS:   No education needs have been identified at this time  Skin:  Skin Assessment: Skin Integrity Issues: Incisions: closed incision to back  Last BM:  no documented BM  Height:   Ht Readings from Last 1 Encounters:  12/29/18 5\' 10"  (1.778 m)    Weight:   Wt Readings from Last 1 Encounters:  12/29/18 78 kg    Ideal Body Weight:  75.45 kg  BMI:  Body mass index is 24.67 kg/m.  Estimated Nutritional Needs:   Kcal:  2100-2300  Protein:  105-120 grams  Fluid:  >/= 2.1 L    Gaynell Face, MS, RD, LDN Inpatient Clinical Dietitian Pager: [phone] Weekend/After Hours: [phone]

## 2018-12-29 NOTE — Progress Notes (Signed)
Progress Note    CHARBEL LOS  KTG:[id number] DOB: April 21, 1961  DOA: 12/28/2018 PCP: [facility]    Brief Narrative:     Medical records reviewed and are as summarized below:  TAUREAN JU is an 58 y.o. male with history of recent surgery for L3-L4 discitis epidural abscess was discharged home 2 weeks ago after surgery on vancomycin which is to be taken up to February 06, 2019 per the discharge summary has not been feeling well last 4 to 5 days with increasing weakness poor appetite.  Found to have AKI.    Assessment/Plan:   Principal Problem:   ARF (acute renal failure) (HCC) Active Problems:   Diskitis   Normochromic normocytic anemia  Acute renal failure -FENA suggestive of renal etiology so ? vanc -vanc level high -CR flat so will defer nephrology consult for now -strict I/Os -continue IVF  Recent surgery for discitis involving L3-L4 with epidural abscess with wound culture growing MRSA/continued leukocytosis -discharged on vancomycin -Given the acute renal failure and elevated trough levels discussed with pharmacy at this time patient will not need another dose of vancomycin at least for 2 days.   -recent methylprednisone  Tobacco abuse  -continue to encourage cessation  Mild discoloration around the right upper extremity PICC line  -duplex negative   Vitals:   12/29/18 0100 12/29/18 0115 12/29/18 0324 12/29/18 0900  BP: 136/83 136/84 126/71 140/69  Pulse: 85 81 79 82  Resp: (!) 21 11 18 18   Temp:   98.2 F (36.8 C) 98 F (36.7 C)  TempSrc:   Oral Oral  SpO2: 98% 95% 98% 96%  Weight:   78 kg   Height:   5\' 10"  (1.778 m)     Intake/Output Summary (Last 24 hours) at 12/29/2018 1018 Last data filed at 12/29/2018 0700 Gross per 24 hour  Intake 1397.91 ml  Output 975 ml  Net 422.91 ml   Filed Weights   12/29/18 0324  Weight: 78 kg     Data Reviewed:   I have personally reviewed following labs and imaging studies:  Labs: Labs  show the following:   Basic Metabolic Panel: Recent Labs  Lab 12/28/18 1858 12/29/18 0830  NA 135 142  K 3.3* 3.4*  CL 99 107  CO2 22 23  GLUCOSE 165* 145*  BUN 17 18  CREATININE 3.05* 3.04*  CALCIUM 8.3* 8.1*   GFR Estimated Creatinine Clearance: 28 mL/min (A) (by C-G formula based on SCr of 3.04 mg/dL (H)). Liver Function Tests: Recent Labs  Lab 12/28/18 1858 12/29/18 0255  AST 23 18  ALT 27 24  ALKPHOS 125 106  BILITOT 0.7 0.5  PROT 6.9 5.9*  ALBUMIN 2.5* 2.1*   No results for input(s): LIPASE, AMYLASE in the last 168 hours. No results for input(s): AMMONIA in the last 168 hours. Coagulation profile No results for input(s): INR, PROTIME in the last 168 hours.  CBC: Recent Labs  Lab 12/28/18 1858 12/29/18 0255  WBC 19.4* 18.7*  NEUTROABS  --  15.3*  HGB 12.5* 10.6*  HCT 36.2* 31.6*  MCV 86.0 87.1  PLT 429* 371   Cardiac Enzymes: No results for input(s): CKTOTAL, CKMB, CKMBINDEX, TROPONINI in the last 168 hours. BNP (last 3 results) No results for input(s): PROBNP in the last 8760 hours. CBG: No results for input(s): GLUCAP in the last 168 hours. D-Dimer: No results for input(s): DDIMER in the last 72 hours. Hgb A1c: Recent Labs    12/29/18 0255  HGBA1C 6.0*   Lipid Profile: No results for input(s): CHOL, HDL, LDLCALC, TRIG, CHOLHDL, LDLDIRECT in the last 72 hours. Thyroid function studies: No results for input(s): TSH, T4TOTAL, T3FREE, THYROIDAB in the last 72 hours.  Invalid input(s): FREET3 Anemia work up: No results for input(s): VITAMINB12, FOLATE, FERRITIN, TIBC, IRON, RETICCTPCT in the last 72 hours. Sepsis Labs: Recent Labs  Lab 12/28/18 1858 12/29/18 0255  WBC 19.4* 18.7*    Microbiology Recent Results (from the past 240 hour(s))  Blood culture (routine x 2)     Status: None (Preliminary result)   Collection Time: 12/28/18 10:15 PM  Result Value Ref Range Status   Specimen Description BLOOD LEFT ARM  Final   Special Requests    Final    BOTTLES DRAWN AEROBIC AND ANAEROBIC Blood Culture adequate volume   Culture   Final    NO GROWTH < 12 HOURS Performed at [facility], [place] [address], [place], [place] [address]    Report Status PENDING  Incomplete  Blood culture (routine x 2)     Status: None (Preliminary result)   Collection Time: 12/28/18 10:18 PM  Result Value Ref Range Status   Specimen Description BLOOD RIGHT FOREARM  Final   Special Requests   Final    BOTTLES DRAWN AEROBIC AND ANAEROBIC Blood Culture adequate volume   Culture   Final    NO GROWTH < 12 HOURS Performed at [facility], [place] [address], [place], [place] [address]    Report Status PENDING  Incomplete  SARS Coronavirus 2 (CEPHEID - Performed in [place] hospital lab), Hosp Order     Status: None   Collection Time: 12/28/18 11:20 PM  Result Value Ref Range Status   SARS Coronavirus 2 NEGATIVE NEGATIVE Final    Comment: (NOTE) If result is NEGATIVE SARS-CoV-2 target nucleic acids are NOT DETECTED. The SARS-CoV-2 RNA is generally detectable in upper and lower  respiratory specimens during the acute phase of infection. The lowest  concentration of SARS-CoV-2 viral copies this assay can detect is 250  copies / mL. A negative result does not preclude SARS-CoV-2 infection  and should not be used as the sole basis for treatment or other  patient management decisions.  A negative result may occur with  improper specimen collection / handling, submission of specimen other  than nasopharyngeal swab, presence of viral mutation(s) within the  areas targeted by this assay, and inadequate number of viral copies  (<250 copies / mL). A negative result must be combined with clinical  observations, patient history, and epidemiological information. If result is POSITIVE SARS-CoV-2 target nucleic acids are DETECTED. The SARS-CoV-2 RNA is generally detectable in upper and lower  respiratory specimens dur ing the acute phase of infection.   Positive  results are indicative of active infection with SARS-CoV-2.  Clinical  correlation with patient history and other diagnostic information is  necessary to determine patient infection status.  Positive results do  not rule out bacterial infection or co-infection with other viruses. If result is PRESUMPTIVE POSTIVE SARS-CoV-2 nucleic acids MAY BE PRESENT.   A presumptive positive result was obtained on the submitted specimen  and confirmed on repeat testing.  While 2019 novel coronavirus  (SARS-CoV-2) nucleic acids may be present in the submitted sample  additional confirmatory testing may be necessary for epidemiological  and / or clinical management purposes  to differentiate between  SARS-CoV-2 and other Sarbecovirus currently known to infect humans.  If clinically indicated additional  testing with an alternate test  methodology [phone]) is advised. The SARS-CoV-2 RNA is generally  detectable in upper and lower respiratory sp ecimens during the acute  phase of infection. The expected result is Negative. Fact Sheet for Patients:  StrictlyIdeas.no Fact Sheet for Healthcare Providers: BankingDealers.co.za This test is not yet approved or cleared by the [place] FDA and has been authorized for detection and/or diagnosis of SARS-CoV-2 by FDA under an Emergency Use Authorization (EUA).  This EUA will remain in effect (meaning this test can be used) for the duration of the COVID-19 declaration under Section 564(b)(1) of the Act, 21 U.S.C. section 360bbb-3(b)(1), unless the authorization is terminated or revoked sooner. Performed at [facility], [place] [address], [place],  [address]     Procedures and diagnostic studies:  Dg Chest Port 1 View  Result Date: 12/28/2018 CLINICAL DATA:  Fever EXAM: PORTABLE CHEST 1 VIEW COMPARISON:  04/13/2004 FINDINGS: Right upper extremity catheter tip over the SVC. No focal  airspace disease or pleural effusion. Normal heart size. No pneumothorax. IMPRESSION: No active disease.  Right upper extremity catheter tip over the SVC Electronically Signed   By: Donavan Foil M.D.   On: 12/28/2018 22:42   Vas [place] Upper Extremity Venous Duplex  Result Date: 12/29/2018 UPPER VENOUS STUDY  Indications: Erythema Performing Technologist: June Leap RDMS, RVT  Examination Guidelines: A complete evaluation includes B-mode imaging, spectral Doppler, color Doppler, and power Doppler as needed of all accessible portions of each vessel. Bilateral testing is considered an integral part of a complete examination. Limited examinations for reoccurring indications may be performed as noted.  Right Findings: +----------+------------+---------+-----------+----------+-------+  RIGHT      Compressible Phasicity Spontaneous Properties Summary  +----------+------------+---------+-----------+----------+-------+  IJV            Full        Yes        Yes                         +----------+------------+---------+-----------+----------+-------+  Subclavian     Full        Yes        Yes                         +----------+------------+---------+-----------+----------+-------+  Axillary       Full        Yes        Yes                         +----------+------------+---------+-----------+----------+-------+  Brachial       Full        Yes        Yes                         +----------+------------+---------+-----------+----------+-------+  Radial         Full                                               +----------+------------+---------+-----------+----------+-------+  Ulnar          Full                                               +----------+------------+---------+-----------+----------+-------+  Cephalic       Full                                               +----------+------------+---------+-----------+----------+-------+  Basilic        Full                                                +----------+------------+---------+-----------+----------+-------+  Left Findings: +----------+------------+---------+-----------+----------+-------+  LEFT       Compressible Phasicity Spontaneous Properties Summary  +----------+------------+---------+-----------+----------+-------+  Subclavian                 Yes        Yes                         +----------+------------+---------+-----------+----------+-------+  Summary:  Right: No evidence of deep vein thrombosis in the upper extremity. No evidence of superficial vein thrombosis in the upper extremity.  Left: No evidence of thrombosis in the subclavian.  *See table(s) above for measurements and observations.    Preliminary     Medications:    enoxaparin (LOVENOX) injection  30 mg Subcutaneous Q24H   pantoprazole  40 mg Oral Daily   sodium chloride flush  10-40 mL Intracatheter Q12H   vitamin C  500 mg Oral QPC breakfast   Continuous Infusions:  sodium chloride       LOS: 0 days   Geradine Girt  [facility]   How to contact the [facility] Attending or Consulting provider [place] or covering provider during after hours [place], for this patient?  1. Check the care team in [facility] and look for a) attending/consulting [facility] provider listed and b) the [facility] team listed 2. Log into [email or web address] and use [facility]'s universal password to access. If you do not have the password, please contact the hospital operator. 3. Locate the [facility] provider you are looking for under [facility] and page to a number that you can be directly reached. 4. If you still have difficulty reaching the provider, please page the [facility] (Director on Call) for the Hospitalists listed on amion for assistance.  12/29/2018, 10:18 AM

## 2018-12-30 DIAGNOSIS — D649 Anemia, unspecified: Secondary | ICD-10-CM

## 2018-12-30 DIAGNOSIS — D72829 Elevated white blood cell count, unspecified: Secondary | ICD-10-CM | POA: Diagnosis present

## 2018-12-30 DIAGNOSIS — D72823 Leukemoid reaction: Secondary | ICD-10-CM

## 2018-12-30 DIAGNOSIS — E860 Dehydration: Secondary | ICD-10-CM | POA: Diagnosis present

## 2018-12-30 DIAGNOSIS — E876 Hypokalemia: Secondary | ICD-10-CM

## 2018-12-30 DIAGNOSIS — Z720 Tobacco use: Secondary | ICD-10-CM

## 2018-12-30 LAB — CBC
HCT: 29.7 % — ABNORMAL LOW (ref 39.0–52.0)
Hemoglobin: 9.9 g/dL — ABNORMAL LOW (ref 13.0–17.0)
MCH: 29.1 pg (ref 26.0–34.0)
MCHC: 33.3 g/dL (ref 30.0–36.0)
MCV: 87.4 fL (ref 80.0–100.0)
Platelets: 404 10*3/uL — ABNORMAL HIGH (ref 150–400)
RBC: 3.4 MIL/uL — ABNORMAL LOW (ref 4.22–5.81)
RDW: 13.8 % (ref 11.5–15.5)
WBC: 13.2 10*3/uL — ABNORMAL HIGH (ref 4.0–10.5)
nRBC: 0 % (ref 0.0–0.2)

## 2018-12-30 LAB — BASIC METABOLIC PANEL
Anion gap: 10 (ref 5–15)
BUN: 18 mg/dL (ref 6–20)
CO2: 25 mmol/L (ref 22–32)
Calcium: 8.1 mg/dL — ABNORMAL LOW (ref 8.9–10.3)
Chloride: 106 mmol/L (ref 98–111)
Creatinine, Ser: 2.93 mg/dL — ABNORMAL HIGH (ref 0.61–1.24)
GFR calc Af Amer: 26 mL/min — ABNORMAL LOW (ref >60)
GFR calc non Af Amer: 23 mL/min — ABNORMAL LOW (ref >60)
Glucose, Bld: 102 mg/dL — ABNORMAL HIGH (ref 70–99)
Potassium: 3.1 mmol/L — ABNORMAL LOW (ref 3.5–5.1)
Sodium: 141 mmol/L (ref 135–145)

## 2018-12-30 LAB — VANCOMYCIN, RANDOM: Vancomycin Rm: 38

## 2018-12-30 MED ORDER — POTASSIUM CHLORIDE 20 MEQ PO PACK
20.0000 meq | PACK | Freq: Once | ORAL | Status: AC
  Administered 2018-12-30: 20 meq via ORAL
  Filled 2018-12-30: qty 1

## 2018-12-30 NOTE — Progress Notes (Signed)
Pharmacy Antibiotic Note  Thomas Simpson is a 58 y.o. male admitted on 12/28/2018 with AKI in the setting of supratherapeutic vancomycin levels. 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 SCr on admit 3 mg/dl (baseline ~0.8 mg/dl) and  vancomycin random was 58 mcg/ml (~36 hrs post last dose?). Pharmacy consulted asked to transition pt to daptomycin.  Given that vancomycin is currently supratherapeutic and continuing to cover the MRSA infection, will hold off on daptomycin initiation for now until vancomycin clears.  5/9: Vancomycin random level still high at 38 (down from 58 in ~36hrs) SCr trending down slowly at 2.93 this AM. Making urine.   Plan: -Repeat Vancomycin random tomorrow AM as slow clearance -Will start Daptomycin when VR <15-20   Height: 5\' 10"  (177.8 cm) Weight: 170 lb 3.1 oz (77.2 kg) IBW/kg (Calculated) : 73  Temp (24hrs), Avg:98.3 F (36.8 C), Min:97.8 F (36.6 C), Max:98.9 F (37.2 C)  Recent Labs  Lab 12/28/18 1858 12/28/18 2354 12/29/18 0255 12/29/18 0830 12/30/18 0436  WBC 19.4*  --  18.7*  --  13.2*  CREATININE 3.05*  --   --  3.04* 2.93*  VANCOTROUGH  --  58*  --   --   --   VANCORANDOM  --   --   --   --  38    Estimated Creatinine Clearance: 29.1 mL/min (A) (by C-G formula based on SCr of 2.93 mg/dL (H)).    No Known Allergies  Antimicrobials this admission: Vancomycin 4/22 >>5/6? Dapto -starting pending VL down   Microbiology results: 5/7 BCx: NGTD 4/22 Lumbar wound: MRSA 4/22 Disc Space: MRSA 4/21 BCx: negative  Thank you for allowing pharmacy to be a part of this patient's care.   Sloan Leiter, PharmD, BCPS, BCCCP Clinical Pharmacist Please refer to [facility] for [facility] Pharmacy numbers 12/30/2018

## 2018-12-30 NOTE — Progress Notes (Signed)
Progress Note    Thomas Simpson  CWC:[id number] DOB: Sep 12, 1960  DOA: 12/28/2018 PCP: [facility]    Brief Narrative:     Medical records reviewed and are as summarized below:  Thomas Simpson is an 58 y.o. male with history of recent surgery for L3-L4 discitis epidural abscess was discharged home 2 weeks ago after surgery on vancomycin which is to be taken up to February 06, 2019 per the discharge summary has not been feeling well last 4 to 5 days with increasing weakness poor appetite. Found to have AKI.    Assessment/Plan:   Principal Problem:   ARF (acute renal failure) (HCC) Active Problems:   Diskitis   Normochromic normocytic anemia   Hypokalemia   Tobacco abuse   Leukocytosis  AKI without history of CKD - POA, improving minimally -FENA suggestive of renal etiology likely 2/2 to prolonged IV vancomycin dosing -Vancomycin level supratherapeutic at intake - 38 -Creatinine minimally improving over the past 48 hours currently 2.93 baseline 0.7 previously this year -Hold off on nephrology consult unless creatinine/BUN increase without clear etiology -strict I/Os -continue IVF - NS @75cc  continuously - follow for volume overload  Status post lumbar laminectomy 12/12/18 for sepsis/discitis involving L3-L4 with lumbar epidural abscess 2/2 MRSA Ongoing leukocytosis -Previously discharged on vancomycin -Given AKI and elevated trough levels pharmacy and ID were consulted, patient changed to daptomycin per their expertise - ABX stop date tentatively 02/06/19 - Patient has PICC line in RUE from previous hosplitalization -Questionable leukocytosis elevated due to recent methylprednisone -ID following we appreciate insight and recs - has f/up appt on 01/03/19 with Dr. Prince Rome  Anemia, subacute, normocytic -Patient had recent surgery, ongoing infection, and AKI - all likely compounding -Continue to follow - no overt sources of bleeding - hold off on further workup  given no need for transfusion and patient not symptomatic  Hypokalemia, ongoing -Follow with morning labs -Hold off on aggressive replacement given AKI as above - will only give 20MEQ x1 PO today   Tobacco abuse  -continue to encourage cessation   Vitals:   12/29/18 2041 12/30/18 0500 12/30/18 0508 12/30/18 1010  BP: (!) 142/71  133/85 (!) 142/79  Pulse: 78  77 71  Resp: 19  19 18   Temp: 98.9 F (37.2 C)  98.3 F (36.8 C) 98.2 F (36.8 C)  TempSrc: Oral  Oral Oral  SpO2: 98%  97% 98%  Weight: 77.2 kg 77.2 kg    Height:        Intake/Output Summary (Last 24 hours) at 12/30/2018 1422 Last data filed at 12/30/2018 1100 Gross per 24 hour  Intake 1712.29 ml  Output 3550 ml  Net -1837.71 ml   Filed Weights   12/29/18 0324 12/29/18 2041 12/30/18 0500  Weight: 78 kg 77.2 kg 77.2 kg     Data Reviewed:   I have personally reviewed following labs and imaging studies:  Labs: Labs show the following:   Basic Metabolic Panel: Recent Labs  Lab 12/28/18 1858 12/29/18 0830 12/30/18 0436  NA 135 142 141  K 3.3* 3.4* 3.1*  CL 99 107 106  CO2 22 23 25   GLUCOSE 165* 145* 102*  BUN 17 18 18   CREATININE 3.05* 3.04* 2.93*  CALCIUM 8.3* 8.1* 8.1*   GFR Estimated Creatinine Clearance: 29.1 mL/min (A) (by C-G formula based on SCr of 2.93 mg/dL (H)). Liver Function Tests: Recent Labs  Lab 12/28/18 1858 12/29/18 0255  AST 23 18  ALT 27 24  ALKPHOS 125 106  BILITOT 0.7 0.5  PROT 6.9 5.9*  ALBUMIN 2.5* 2.1*   No results for input(s): LIPASE, AMYLASE in the last 168 hours. No results for input(s): AMMONIA in the last 168 hours. Coagulation profile No results for input(s): INR, PROTIME in the last 168 hours.  CBC: Recent Labs  Lab 12/28/18 1858 12/29/18 0255 12/30/18 0436  WBC 19.4* 18.7* 13.2*  NEUTROABS  --  15.3*  --   HGB 12.5* 10.6* 9.9*  HCT 36.2* 31.6* 29.7*  MCV 86.0 87.1 87.4  PLT 429* 371 404*   Cardiac Enzymes: No results for input(s): CKTOTAL, CKMB,  CKMBINDEX, TROPONINI in the last 168 hours. BNP (last 3 results) No results for input(s): PROBNP in the last 8760 hours. CBG: No results for input(s): GLUCAP in the last 168 hours. D-Dimer: No results for input(s): DDIMER in the last 72 hours. Hgb A1c: Recent Labs    12/29/18 0255  HGBA1C 6.0*   Lipid Profile: No results for input(s): CHOL, HDL, LDLCALC, TRIG, CHOLHDL, LDLDIRECT in the last 72 hours. Thyroid function studies: No results for input(s): TSH, T4TOTAL, T3FREE, THYROIDAB in the last 72 hours.  Invalid input(s): FREET3 Anemia work up: No results for input(s): VITAMINB12, FOLATE, FERRITIN, TIBC, IRON, RETICCTPCT in the last 72 hours. Sepsis Labs: Recent Labs  Lab 12/28/18 1858 12/29/18 0255 12/30/18 0436  WBC 19.4* 18.7* 13.2*    Microbiology Recent Results (from the past 240 hour(s))  Blood culture (routine x 2)     Status: None (Preliminary result)   Collection Time: 12/28/18 10:15 PM  Result Value Ref Range Status   Specimen Description BLOOD LEFT ARM  Final   Special Requests   Final    BOTTLES DRAWN AEROBIC AND ANAEROBIC Blood Culture adequate volume   Culture   Final    NO GROWTH < 24 HOURS Performed at [facility], [place] [address] [place], [place] [address]    Report Status PENDING  Incomplete  Blood culture (routine x 2)     Status: None (Preliminary result)   Collection Time: 12/28/18 10:18 PM  Result Value Ref Range Status   Specimen Description BLOOD RIGHT FOREARM  Final   Special Requests   Final    BOTTLES DRAWN AEROBIC AND ANAEROBIC Blood Culture adequate volume   Culture   Final    NO GROWTH < 24 HOURS Performed at [facility], [place] [address], [place], [place] [address]    Report Status PENDING  Incomplete  SARS Coronavirus 2 (CEPHEID - Performed in [place] hospital lab), Hosp Order     Status: None   Collection Time: 12/28/18 11:20 PM  Result Value Ref Range Status   SARS Coronavirus 2 NEGATIVE NEGATIVE Final     Comment: (NOTE) If result is NEGATIVE SARS-CoV-2 target nucleic acids are NOT DETECTED. The SARS-CoV-2 RNA is generally detectable in upper and lower  respiratory specimens during the acute phase of infection. The lowest  concentration of SARS-CoV-2 viral copies this assay can detect is 250  copies / mL. A negative result does not preclude SARS-CoV-2 infection  and should not be used as the sole basis for treatment or other  patient management decisions.  A negative result may occur with  improper specimen collection / handling, submission of specimen other  than nasopharyngeal swab, presence of viral mutation(s) within the  areas targeted by this assay, and inadequate number of viral copies  (<250 copies / mL). A negative result must be combined with  clinical  observations, patient history, and epidemiological information. If result is POSITIVE SARS-CoV-2 target nucleic acids are DETECTED. The SARS-CoV-2 RNA is generally detectable in upper and lower  respiratory specimens dur ing the acute phase of infection.  Positive  results are indicative of active infection with SARS-CoV-2.  Clinical  correlation with patient history and other diagnostic information is  necessary to determine patient infection status.  Positive results do  not rule out bacterial infection or co-infection with other viruses. If result is PRESUMPTIVE POSTIVE SARS-CoV-2 nucleic acids MAY BE PRESENT.   A presumptive positive result was obtained on the submitted specimen  and confirmed on repeat testing.  While 2019 novel coronavirus  (SARS-CoV-2) nucleic acids may be present in the submitted sample  additional confirmatory testing may be necessary for epidemiological  and / or clinical management purposes  to differentiate between  SARS-CoV-2 and other Sarbecovirus currently known to infect humans.  If clinically indicated additional testing with an alternate test  methodology [phone]) is advised. The SARS-CoV-2  RNA is generally  detectable in upper and lower respiratory sp ecimens during the acute  phase of infection. The expected result is Negative. Fact Sheet for Patients:  StrictlyIdeas.no Fact Sheet for Healthcare Providers: BankingDealers.co.za This test is not yet approved or cleared by the [place] FDA and has been authorized for detection and/or diagnosis of SARS-CoV-2 by FDA under an Emergency Use Authorization (EUA).  This EUA will remain in effect (meaning this test can be used) for the duration of the COVID-19 declaration under Section 564(b)(1) of the Act, 21 U.S.C. section 360bbb-3(b)(1), unless the authorization is terminated or revoked sooner. Performed at [facility], [place] [address], [place], [place] [address]     Procedures and diagnostic studies:  Dg Chest Port 1 View  Result Date: 12/28/2018 CLINICAL DATA:  Fever EXAM: PORTABLE CHEST 1 VIEW COMPARISON:  04/13/2004 FINDINGS: Right upper extremity catheter tip over the SVC. No focal airspace disease or pleural effusion. Normal heart size. No pneumothorax. IMPRESSION: No active disease.  Right upper extremity catheter tip over the SVC Electronically Signed   By: Donavan Foil M.D.   On: 12/28/2018 22:42   Vas [place] Upper Extremity Venous Duplex  Result Date: 12/29/2018 UPPER VENOUS STUDY  Indications: Erythema Performing Technologist: June Leap RDMS, RVT  Examination Guidelines: A complete evaluation includes B-mode imaging, spectral Doppler, color Doppler, and power Doppler as needed of all accessible portions of each vessel. Bilateral testing is considered an integral part of a complete examination. Limited examinations for reoccurring indications may be performed as noted.  Right Findings: +----------+------------+---------+-----------+----------+-------+ RIGHT     CompressiblePhasicitySpontaneousPropertiesSummary  +----------+------------+---------+-----------+----------+-------+ IJV           Full       Yes       Yes                      +----------+------------+---------+-----------+----------+-------+ Subclavian    Full       Yes       Yes                      +----------+------------+---------+-----------+----------+-------+ Axillary      Full       Yes       Yes                      +----------+------------+---------+-----------+----------+-------+ Brachial      Full       Yes  Yes                      +----------+------------+---------+-----------+----------+-------+ Radial        Full                                          +----------+------------+---------+-----------+----------+-------+ Ulnar         Full                                          +----------+------------+---------+-----------+----------+-------+ Cephalic      Full                                          +----------+------------+---------+-----------+----------+-------+ Basilic       Full                                          +----------+------------+---------+-----------+----------+-------+  Left Findings: +----------+------------+---------+-----------+----------+-------+ LEFT      CompressiblePhasicitySpontaneousPropertiesSummary +----------+------------+---------+-----------+----------+-------+ Subclavian               Yes       Yes                      +----------+------------+---------+-----------+----------+-------+  Summary:  Right: No evidence of deep vein thrombosis in the upper extremity. No evidence of superficial vein thrombosis in the upper extremity.  Left: No evidence of thrombosis in the subclavian.  *See table(s) above for measurements and observations.    Preliminary     Medications:   . enoxaparin (LOVENOX) injection  30 mg Subcutaneous Q24H  . feeding supplement (ENSURE ENLIVE)  237 mL Oral BID BM  . multivitamin with minerals  1 tablet Oral  Daily  . pantoprazole  40 mg Oral Daily  . potassium chloride  20 mEq Oral Once  . sodium chloride flush  10-40 mL Intracatheter Q12H  . vitamin C  500 mg Oral QPC breakfast   Continuous Infusions: . sodium chloride 75 mL/hr at 12/30/18 0327     LOS: 1 day   [facility]   How to contact the [facility] Center [facility] Attending or Consulting provider [place] or covering provider during after hours [place], for this patient?  1. Check the care team in [facility] and look for a) attending/consulting [facility] provider listed and b) the [facility] team listed 2. Log into [email or web address] and use [place]'s universal password to access. If you do not have the password, please contact the hospital operator. 3. Locate the [facility] provider you are looking for under [facility] and page to a number that you can be directly reached. 4. If you still have difficulty reaching the provider, please page the [facility] (Director on Call) for the Hospitalists listed on amion for assistance.  12/30/2018, 2:22 PM

## 2018-12-31 ENCOUNTER — Inpatient Hospital Stay (HOSPITAL_COMMUNITY): Payer: 59

## 2018-12-31 LAB — CK: Total CK: 14 U/L — ABNORMAL LOW (ref 49–397)

## 2018-12-31 LAB — BASIC METABOLIC PANEL
Anion gap: 12 (ref 5–15)
BUN: 18 mg/dL (ref 6–20)
CO2: 27 mmol/L (ref 22–32)
Calcium: 8.3 mg/dL — ABNORMAL LOW (ref 8.9–10.3)
Chloride: 102 mmol/L (ref 98–111)
Creatinine, Ser: 2.87 mg/dL — ABNORMAL HIGH (ref 0.61–1.24)
GFR calc Af Amer: 27 mL/min — ABNORMAL LOW (ref >60)
GFR calc non Af Amer: 23 mL/min — ABNORMAL LOW (ref >60)
Glucose, Bld: 106 mg/dL — ABNORMAL HIGH (ref 70–99)
Potassium: 3.2 mmol/L — ABNORMAL LOW (ref 3.5–5.1)
Sodium: 141 mmol/L (ref 135–145)

## 2018-12-31 LAB — CBC
HCT: 30 % — ABNORMAL LOW (ref 39.0–52.0)
Hemoglobin: 10.1 g/dL — ABNORMAL LOW (ref 13.0–17.0)
MCH: 29.4 pg (ref 26.0–34.0)
MCHC: 33.7 g/dL (ref 30.0–36.0)
MCV: 87.2 fL (ref 80.0–100.0)
Platelets: 452 10*3/uL — ABNORMAL HIGH (ref 150–400)
RBC: 3.44 MIL/uL — ABNORMAL LOW (ref 4.22–5.81)
RDW: 13.9 % (ref 11.5–15.5)
WBC: 12.7 10*3/uL — ABNORMAL HIGH (ref 4.0–10.5)
nRBC: 0 % (ref 0.0–0.2)

## 2018-12-31 LAB — VANCOMYCIN, RANDOM: Vancomycin Rm: 31

## 2018-12-31 MED ORDER — POTASSIUM CHLORIDE CRYS ER 20 MEQ PO TBCR
20.0000 meq | EXTENDED_RELEASE_TABLET | Freq: Once | ORAL | Status: AC
  Administered 2018-12-31: 20 meq via ORAL
  Filled 2018-12-31: qty 1

## 2018-12-31 NOTE — Progress Notes (Signed)
Pharmacy Antibiotic Note  Thomas Simpson is a 58 y.o. male admitted on 12/28/2018 with AKI in the setting of supratherapeutic vancomycin levels. 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 SCr on admit 3 mg/dl (baseline ~0.8 mg/dl) and  vancomycin random was 58 mcg/ml (~36 hrs post last dose?). Pharmacy consulted asked to transition pt to daptomycin.  Given that vancomycin is currently supratherapeutic and continuing to cover the MRSA infection, will hold off on daptomycin initiation for now until vancomycin clears.  5/10 Update: Vancomycin random level still high at 31 -- very slow to clear despite great UOP and SCr trending down. Kidneys not filtering appropriately.   Plan: -Will order daily VR to assess for clearance -Will start Daptomycin when VR <15-20 *Would suggest a Nephrology consult as down-trending SCr and good UOP not reflective of kidney function as vancomycin levels remain elevated   Height: 5\' 10"  (177.8 cm) Weight: 168 lb 1.6 oz (76.2 kg) IBW/kg (Calculated) : 73  Temp (24hrs), Avg:98.4 F (36.9 C), Min:98.2 F (36.8 C), Max:98.8 F (37.1 C)  Recent Labs  Lab 12/28/18 1858 12/28/18 2354 12/29/18 0255 12/29/18 0830 12/30/18 0436 12/31/18 0448  WBC 19.4*  --  18.7*  --  13.2* 12.7*  CREATININE 3.05*  --   --  3.04* 2.93* 2.87*  VANCOTROUGH  --  58*  --   --   --   --   VANCORANDOM  --   --   --   --  38 31    Estimated Creatinine Clearance: 29.7 mL/min (A) (by C-G formula based on SCr of 2.87 mg/dL (H)).    No Known Allergies  Antimicrobials this admission: Vancomycin 4/22 >>5/6? Dapto -starting pending VL down   Microbiology results: 5/7 BCx: NGTD 4/22 Lumbar wound: MRSA 4/22 Disc Space: MRSA 4/21 BCx: negative  Thank you for allowing pharmacy to be a part of this patient's care.   Sloan Leiter, PharmD, BCPS, BCCCP Clinical Pharmacist Please refer to [facility] for [facility] Pharm[facility] numbers 12/31/2018

## 2018-12-31 NOTE — Progress Notes (Signed)
PROGRESS NOTE    Thomas Simpson  GHW:[id number] DOB: 10/22/1960 DOA: 12/28/2018 PCP: [facility]   Brief Narrative:  Thomas Simpson is an 58 y.o. male withhistory of recent surgery for L3-L4 discitis epidural abscess was discharged home 2 weeks ago after surgery on vancomycin which is to be taken up to February 06, 2019 per the discharge summary has not been feeling well last 4 to 5 days with increasing weakness poor appetite. Found to have AKI.    Assessment & Plan:   Principal Problem:   ARF (acute renal failure) (HCC) Active Problems:   Lumbar discitis   Normochromic normocytic anemia   Hypokalemia   Tobacco abuse   Leukocytosis   Dehydration   AKI without history of CKD - POA, improving minimally -baseline creatinine <1, peaked at 3.04 -  2.87 today -FENA suggestive of intrinsic cause -Suspected due to prolonged IV vanc -Supratherapeutic vanc on admission - 58 - UA was bland  -No renal US, will follow this today -Creatinine improving very slowly, will continue to follow with gentle hydration -consider renal consult if not continuing to improve -strict I/Os -continue IVF - NS @75cc  continuously - follow for volume overload  Status post lumbar laminectomy 12/12/18 for sepsis/discitis involving L3-L4 with lumbar epidural abscess 2/2 MRSA Ongoing leukocytosis -Previously discharged on vancomycin -Given AKI and elevated trough levels pharmacy and ID were consulted, patient changed to daptomycin per their expertise- ABX stop date tentatively 02/06/19 - Patient has PICC line in RUE from previous hosplitalization -Questionable leukocytosis elevated due to recent methylprednisone -ID following we appreciate insight and recs - has f/up appt on 01/03/19 with Dr. Prince Rome  Anemia, subacute, normocytic -Patient had recent surgery, ongoing infection, and AKI - all likely compounding -Continue to follow - no overt sources of bleeding - hold off on further workup  given no need for transfusion and patient not symptomatic  Hypokalemia, ongoing -Follow with morning labs -gentle k supplementation   Tobacco abuse -continue to encourage cessation  DVT prophylaxis: lovenox Code Status: full  Family Communication: none at bedside Disposition Plan: pending further improvement in renal function  Consultants:   ID  Procedures:   none  Antimicrobials:  Anti-infectives (From admission, onward)   None         Subjective: Feeling well  Objective: Vitals:   12/30/18 2230 12/31/18 0300 12/31/18 0401 12/31/18 0937  BP: (!) 174/94  (!) 143/90 (!) 151/77  Pulse: 89  77 70  Resp: 18  18 18   Temp: 98.2 F (36.8 C)  98.8 F (37.1 C) 98.4 F (36.9 C)  TempSrc: Oral  Oral Oral  SpO2: 97%  97% 97%  Weight: 76.2 kg 76.2 kg    Height:        Intake/Output Summary (Last 24 hours) at 12/31/2018 1629 Last data filed at 12/31/2018 0840 Gross per 24 hour  Intake 480 ml  Output 2625 ml  Net -2145 ml   Filed Weights   12/30/18 0500 12/30/18 2230 12/31/18 0300  Weight: 77.2 kg 76.2 kg 76.2 kg    Examination:  General exam: Appears calm and comfortable  Respiratory system: Clear to auscultation. Respiratory effort normal. Cardiovascular system: S1 & S2 heard, RRR. Gastrointestinal system: Abdomen is nondistended, soft and nontender.  Central nervous system: Alert and oriented. No focal neurological deficits. Extremities: no lee Skin: No rashes, lesions or ulcers Psychiatry: Judgement and insight appear normal. Mood & affect appropriate.     Data Reviewed: I have personally reviewed following labs  and imaging studies  CBC: Recent Labs  Lab 12/28/18 1858 12/29/18 0255 12/30/18 0436 12/31/18 0448  WBC 19.4* 18.7* 13.2* 12.7*  NEUTROABS  --  15.3*  --   --   HGB 12.5* 10.6* 9.9* 10.1*  HCT 36.2* 31.6* 29.7* 30.0*  MCV 86.0 87.1 87.4 87.2  PLT 429* 371 404* 440*   Basic Metabolic Panel: Recent Labs  Lab 12/28/18 1858  12/29/18 0830 12/30/18 0436 12/31/18 0448  NA 135 142 141 141  K 3.3* 3.4* 3.1* 3.2*  CL 99 107 106 102  CO2 22 23 25 27   GLUCOSE 165* 145* 102* 106*  BUN 17 18 18 18   CREATININE 3.05* 3.04* 2.93* 2.87*  CALCIUM 8.3* 8.1* 8.1* 8.3*   GFR: Estimated Creatinine Clearance: 29.7 mL/min (A) (by C-G formula based on SCr of 2.87 mg/dL (H)). Liver Function Tests: Recent Labs  Lab 12/28/18 1858 12/29/18 0255  AST 23 18  ALT 27 24  ALKPHOS 125 106  BILITOT 0.7 0.5  PROT 6.9 5.9*  ALBUMIN 2.5* 2.1*   No results for input(s): LIPASE, AMYLASE in the last 168 hours. No results for input(s): AMMONIA in the last 168 hours. Coagulation Profile: No results for input(s): INR, PROTIME in the last 168 hours. Cardiac Enzymes: Recent Labs  Lab 12/31/18 0448  CKTOTAL 14*   BNP (last 3 results) No results for input(s): PROBNP in the last 8760 hours. HbA1C: Recent Labs    12/29/18 0255  HGBA1C 6.0*   CBG: No results for input(s): GLUCAP in the last 168 hours. Lipid Profile: No results for input(s): CHOL, HDL, LDLCALC, TRIG, CHOLHDL, LDLDIRECT in the last 72 hours. Thyroid Function Tests: No results for input(s): TSH, T4TOTAL, FREET4, T3FREE, THYROIDAB in the last 72 hours. Anemia Panel: No results for input(s): VITAMINB12, FOLATE, FERRITIN, TIBC, IRON, RETICCTPCT in the last 72 hours. Sepsis Labs: No results for input(s): PROCALCITON, LATICACIDVEN in the last 168 hours.  Recent Results (from the past 240 hour(s))  Blood culture (routine x 2)     Status: None (Preliminary result)   Collection Time: 12/28/18 10:15 PM  Result Value Ref Range Status   Specimen Description BLOOD LEFT ARM  Final   Special Requests   Final    BOTTLES DRAWN AEROBIC AND ANAEROBIC Blood Culture adequate volume   Culture   Final    NO GROWTH 3 DAYS Performed at [facility], [address], [place], [place] [address]    Report Status PENDING  Incomplete  Blood culture (routine x 2)     Status:  None (Preliminary result)   Collection Time: 12/28/18 10:18 PM  Result Value Ref Range Status   Specimen Description BLOOD RIGHT FOREARM  Final   Special Requests   Final    BOTTLES DRAWN AEROBIC AND ANAEROBIC Blood Culture adequate volume   Culture   Final    NO GROWTH 3 DAYS Performed at [facility], [place] [address], [place], [place] [address]    Report Status PENDING  Incomplete  SARS Coronavirus 2 (CEPHEID - Performed in [place] hospital lab), Hosp Order     Status: None   Collection Time: 12/28/18 11:20 PM  Result Value Ref Range Status   SARS Coronavirus 2 NEGATIVE NEGATIVE Final    Comment: (NOTE) If result is NEGATIVE SARS-CoV-2 target nucleic acids are NOT DETECTED. The SARS-CoV-2 RNA is generally detectable in upper and lower  respiratory specimens during the acute phase of infection. The lowest  concentration of SARS-CoV-2 viral  copies this assay can detect is 250  copies / mL. A negative result does not preclude SARS-CoV-2 infection  and should not be used as the sole basis for treatment or other  patient management decisions.  A negative result may occur with  improper specimen collection / handling, submission of specimen other  than nasopharyngeal swab, presence of viral mutation(s) within the  areas targeted by this assay, and inadequate number of viral copies  (<250 copies / mL). A negative result must be combined with clinical  observations, patient history, and epidemiological information. If result is POSITIVE SARS-CoV-2 target nucleic acids are DETECTED. The SARS-CoV-2 RNA is generally detectable in upper and lower  respiratory specimens dur ing the acute phase of infection.  Positive  results are indicative of active infection with SARS-CoV-2.  Clinical  correlation with patient history and other diagnostic information is  necessary to determine patient infection status.  Positive results do  not rule out bacterial infection or co-infection with  other viruses. If result is PRESUMPTIVE POSTIVE SARS-CoV-2 nucleic acids MAY BE PRESENT.   A presumptive positive result was obtained on the submitted specimen  and confirmed on repeat testing.  While 2019 novel coronavirus  (SARS-CoV-2) nucleic acids may be present in the submitted sample  additional confirmatory testing may be necessary for epidemiological  and / or clinical management purposes  to differentiate between  SARS-CoV-2 and other Sarbecovirus currently known to infect humans.  If clinically indicated additional testing with an alternate test  methodology [phone]) is advised. The SARS-CoV-2 RNA is generally  detectable in upper and lower respiratory sp ecimens during the acute  phase of infection. The expected result is Negative. Fact Sheet for Patients:  StrictlyIdeas.no Fact Sheet for Healthcare Providers: BankingDealers.co.za This test is not yet approved or cleared by the [place] FDA and has been authorized for detection and/or diagnosis of SARS-CoV-2 by FDA under an Emergency Use Authorization (EUA).  This EUA will remain in effect (meaning this test can be used) for the duration of the COVID-19 declaration under Section 564(b)(1) of the Act, 21 U.S.C. section 360bbb-3(b)(1), unless the authorization is terminated or revoked sooner. Performed at [facility], [place] [address], [place], [place] [address]          Radiology Studies: No results found.      Scheduled Meds: . enoxaparin (LOVENOX) injection  30 mg Subcutaneous Q24H  . feeding supplement (ENSURE ENLIVE)  237 mL Oral BID BM  . multivitamin with minerals  1 tablet Oral Daily  . pantoprazole  40 mg Oral Daily  . sodium chloride flush  10-40 mL Intracatheter Q12H  . vitamin C  500 mg Oral QPC breakfast   Continuous Infusions: . sodium chloride 75 mL/hr at 12/31/18 0556     LOS: 2 days    Time spent: over 30 min    Fayrene Helper, MD [facility] Pager AMION  If 7PM-7AM, please contact night-coverage [email or web address] Password [facility] 12/31/2018, 4:29 PM

## 2019-01-01 DIAGNOSIS — Z95828 Presence of other vascular implants and grafts: Secondary | ICD-10-CM

## 2019-01-01 DIAGNOSIS — G061 Intraspinal abscess and granuloma: Secondary | ICD-10-CM

## 2019-01-01 DIAGNOSIS — T82898A Other specified complication of vascular prosthetic devices, implants and grafts, initial encounter: Secondary | ICD-10-CM

## 2019-01-01 LAB — BASIC METABOLIC PANEL
Anion gap: 17 — ABNORMAL HIGH (ref 5–15)
BUN: 17 mg/dL (ref 6–20)
CO2: 24 mmol/L (ref 22–32)
Calcium: 8.7 mg/dL — ABNORMAL LOW (ref 8.9–10.3)
Chloride: 97 mmol/L — ABNORMAL LOW (ref 98–111)
Creatinine, Ser: 2.6 mg/dL — ABNORMAL HIGH (ref 0.61–1.24)
GFR calc Af Amer: 31 mL/min — ABNORMAL LOW (ref >60)
GFR calc non Af Amer: 26 mL/min — ABNORMAL LOW (ref >60)
Glucose, Bld: 115 mg/dL — ABNORMAL HIGH (ref 70–99)
Potassium: 3.1 mmol/L — ABNORMAL LOW (ref 3.5–5.1)
Sodium: 138 mmol/L (ref 135–145)

## 2019-01-01 LAB — CBC
HCT: 32.7 % — ABNORMAL LOW (ref 39.0–52.0)
Hemoglobin: 11.1 g/dL — ABNORMAL LOW (ref 13.0–17.0)
MCH: 29.3 pg (ref 26.0–34.0)
MCHC: 33.9 g/dL (ref 30.0–36.0)
MCV: 86.3 fL (ref 80.0–100.0)
Platelets: 499 10*3/uL — ABNORMAL HIGH (ref 150–400)
RBC: 3.79 MIL/uL — ABNORMAL LOW (ref 4.22–5.81)
RDW: 13.6 % (ref 11.5–15.5)
WBC: 15.6 10*3/uL — ABNORMAL HIGH (ref 4.0–10.5)
nRBC: 0 % (ref 0.0–0.2)

## 2019-01-01 LAB — VANCOMYCIN, RANDOM: Vancomycin Rm: 23

## 2019-01-01 MED ORDER — POTASSIUM CHLORIDE CRYS ER 20 MEQ PO TBCR
40.0000 meq | EXTENDED_RELEASE_TABLET | Freq: Once | ORAL | Status: AC
  Administered 2019-01-01: 40 meq via ORAL
  Filled 2019-01-01: qty 2

## 2019-01-01 NOTE — Progress Notes (Signed)
[place] [facility] Infectious Disease    Date of Admission:  12/28/2018     Total days of antibiotics 19               Reason for Consult: MRSA discitis and abscess   Referring Provider: Eliseo Squires / [place] Primary Care Provider: [facility]   Assessment/Plan: Mr. Thomas Simpson is a 58 y/o male smoker who underwent surgical intervention for lumbar epidural abscess and discitis/osteomyelitis of L3-4 on 4/22. He was discharged home with vancomycin for MRSA infection with end date of 6/16. Unfortunately he was readmitted with acute renal failure and mild discoloration around his PICC line. Vancomycin levels through home health were found to be as high as 74.  Acute renal failure - PTA Cr 0.7 >> over 3. Now down to 2.60. Related to very elevated Vancomycin levels (>70). Random level 23 today which is encouraging. He is making adequate urine and is doing well otherwise. I am hopeful that he will continue to improve to where he can be discharged home soon.   Lumbar epidural abscess and Discitis/osteomyelitis of L3-4 - MRSA infection and will need continued treatment through 02/06/19 per initial plan. Continue Daptomycin - will discuss with [facility] team about financial cost for him. He is quite worried about money and the future at work at the moment.   Discoloration around PICC line - Unremarkable today. Functioning as expected.     Principal Problem:   ARF (acute renal failure) (HCC) Active Problems:   Lumbar discitis   Normochromic normocytic anemia   Hypokalemia   Tobacco abuse   Leukocytosis   Dehydration   . enoxaparin (LOVENOX) injection  30 mg Subcutaneous Q24H  . feeding supplement (ENSURE ENLIVE)  237 mL Oral BID BM  . multivitamin with minerals  1 tablet Oral Daily  . pantoprazole  40 mg Oral Daily  . sodium chloride flush  10-40 mL Intracatheter Q12H  . vitamin C  500 mg Oral QPC breakfast    Interval Hx:  Nervous about his infection not being treated and worried  about treatment interruption. Very worried about financial impact of his illness and that he may not have a job after this.   Review of Systems: Review of Systems  Constitutional: Positive for fever. Negative for chills and weight loss.  Respiratory: Negative for cough, shortness of breath and wheezing.   Cardiovascular: Negative for chest pain and leg swelling.  Gastrointestinal: Negative for abdominal pain, constipation, diarrhea, nausea and vomiting.  Musculoskeletal: Positive for back pain.  Skin: Negative for rash.     Past Medical History:  Diagnosis Date  . Abscess 11/2018   LUMBAR  . Asthma   . Epidural abscess    L3-4    Social History   Tobacco Use  . Smoking status: Current Every Day Smoker    Packs/day: 1.00    Years: 30.00    Pack years: 30.00    Types: Cigarettes  . Smokeless tobacco: Never Used  Substance Use Topics  . Alcohol use: Never    Frequency: Never  . Drug use: Never    No Known Allergies  OBJECTIVE: Blood pressure (!) 145/76, pulse 79, temperature 98.4 F (36.9 C), temperature source Oral, resp. rate 18, height 5\' 10"  (1.778 m), weight 75.7 kg, SpO2 98 %.  Physical Exam Constitutional:      General: He is not in acute distress.    Appearance: He is well-developed.  HENT:     Mouth/Throat:  Mouth: Mucous membranes are moist.     Pharynx: Oropharynx is clear.  Cardiovascular:     Rate and Rhythm: Normal rate and regular rhythm.     Heart sounds: Normal heart sounds.  Pulmonary:     Effort: Pulmonary effort is normal.     Breath sounds: Normal breath sounds.  Skin:    General: Skin is warm and dry.     Capillary Refill: Capillary refill takes less than 2 seconds.     Comments: PICC line dressing is clean and dry. Biopatch around insertion site.   Neurological:     Mental Status: He is alert and oriented to person, place, and time.  Psychiatric:        Behavior: Behavior normal.        Thought Content: Thought content normal.         Judgment: Judgment normal.    Lab Results Lab Results  Component Value Date   WBC 15.6 (H) 01/01/2019   HGB 11.1 (L) 01/01/2019   HCT 32.7 (L) 01/01/2019   MCV 86.3 01/01/2019   PLT 499 (H) 01/01/2019    Lab Results  Component Value Date   CREATININE 2.60 (H) 01/01/2019   BUN 17 01/01/2019   NA 138 01/01/2019   K 3.1 (L) 01/01/2019   CL 97 (L) 01/01/2019   CO2 24 01/01/2019    Lab Results  Component Value Date   ALT 24 12/29/2018   AST 18 12/29/2018   ALKPHOS 106 12/29/2018   BILITOT 0.5 12/29/2018     Microbiology: Recent Results (from the past 240 hour(s))  Blood culture (routine x 2)     Status: None (Preliminary result)   Collection Time: 12/28/18 10:15 PM  Result Value Ref Range Status   Specimen Description BLOOD LEFT ARM  Final   Special Requests   Final    BOTTLES DRAWN AEROBIC AND ANAEROBIC Blood Culture adequate volume   Culture   Final    NO GROWTH 4 DAYS Performed at [facility], [place] [address] [place], [place] [address]    Report Status PENDING  Incomplete  Blood culture (routine x 2)     Status: None (Preliminary result)   Collection Time: 12/28/18 10:18 PM  Result Value Ref Range Status   Specimen Description BLOOD RIGHT FOREARM  Final   Special Requests   Final    BOTTLES DRAWN AEROBIC AND ANAEROBIC Blood Culture adequate volume   Culture   Final    NO GROWTH 4 DAYS Performed at [facility], [place] [address], [place], [place] [address]    Report Status PENDING  Incomplete  SARS Coronavirus 2 (CEPHEID - Performed in [place] hospital lab), Hosp Order     Status: None   Collection Time: 12/28/18 11:20 PM  Result Value Ref Range Status   SARS Coronavirus 2 NEGATIVE NEGATIVE Final    Comment: (NOTE) If result is NEGATIVE SARS-CoV-2 target nucleic acids are NOT DETECTED. The SARS-CoV-2 RNA is generally detectable in upper and lower  respiratory specimens during the acute phase of infection. The lowest  concentration of  SARS-CoV-2 viral copies this assay can detect is 250  copies / mL. A negative result does not preclude SARS-CoV-2 infection  and should not be used as the sole basis for treatment or other  patient management decisions.  A negative result may occur with  improper specimen collection / handling, submission of specimen other  than nasopharyngeal swab, presence of viral mutation(s) within the  areas targeted by this assay, and inadequate number of viral copies  (<250 copies / mL). A negative result must be combined with clinical  observations, patient history, and epidemiological information. If result is POSITIVE SARS-CoV-2 target nucleic acids are DETECTED. The SARS-CoV-2 RNA is generally detectable in upper and lower  respiratory specimens dur ing the acute phase of infection.  Positive  results are indicative of active infection with SARS-CoV-2.  Clinical  correlation with patient history and other diagnostic information is  necessary to determine patient infection status.  Positive results do  not rule out bacterial infection or co-infection with other viruses. If result is PRESUMPTIVE POSTIVE SARS-CoV-2 nucleic acids MAY BE PRESENT.   A presumptive positive result was obtained on the submitted specimen  and confirmed on repeat testing.  While 2019 novel coronavirus  (SARS-CoV-2) nucleic acids may be present in the submitted sample  additional confirmatory testing may be necessary for epidemiological  and / or clinical management purposes  to differentiate between  SARS-CoV-2 and other Sarbecovirus currently known to infect humans.  If clinically indicated additional testing with an alternate test  methodology [phone]) is advised. The SARS-CoV-2 RNA is generally  detectable in upper and lower respiratory sp ecimens during the acute  phase of infection. The expected result is Negative. Fact Sheet for Patients:  StrictlyIdeas.no Fact Sheet for Healthcare  Providers: BankingDealers.co.za This test is not yet approved or cleared by the [place] FDA and has been authorized for detection and/or diagnosis of SARS-CoV-2 by FDA under an Emergency Use Authorization (EUA).  This EUA will remain in effect (meaning this test can be used) for the duration of the COVID-19 declaration under Section 564(b)(1) of the Act, 21 U.S.C. section 360bbb-3(b)(1), unless the authorization is terminated or revoked sooner. Performed at [facility], [place] [address] [place], [place] [address]     Janene Madeira, MSN, NP-C [place] [facility] [email or web address] Pager: [phone] Office: [phone] [place]: [phone]

## 2019-01-01 NOTE — Progress Notes (Signed)
Pharmacy Antibiotic Note  Thomas Simpson is a 58 y.o. male admitted on 12/28/2018 with AKI in the setting of supratherapeutic vancomycin levels. 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 SCr on admit 3 mg/dl (baseline ~0.8 mg/dl) and  vancomycin random was 58 mcg/ml (~36 hrs post last dose?). Pharmacy consulted asked to transition pt to daptomycin.   Vancomycin random level this morning 23 which is still slightly supratherapeutic. Scr down to 2.60 and UOP 1.7/ ml/kg/hour. Will plan to dose daptomycin when vancomycin clears. Patient has cleared vancomycin more quickly in the past 24 hours 31>>23.    Plan: -Repeat Vancomycin random tomorrow AM as slow clearance -Will start Daptomycin when VR <15-20   Height: 5\' 10"  (177.8 cm) Weight: 166 lb 14.2 oz (75.7 kg) IBW/kg (Calculated) : 73  Temp (24hrs), Avg:98.6 F (37 C), Min:98.4 F (36.9 C), Max:98.9 F (37.2 C)  Recent Labs  Lab 12/28/18 1858 12/28/18 2354 12/29/18 0255 12/29/18 0830  12/30/18 0436 12/31/18 0448 01/01/19 0415  WBC 19.4*  --  18.7*  --   --  13.2* 12.7* 15.6*  CREATININE 3.05*  --   --  3.04*  --  2.93* 2.87* 2.60*  VANCOTROUGH  --  58*  --   --   --   --   --   --   VANCORANDOM  --   --   --   --    < > 38 31 23   < > = values in this interval not displayed.    Estimated Creatinine Clearance: 32.8 mL/min (A) (by C-G formula based on SCr of 2.6 mg/dL (H)).    No Known Allergies  Antimicrobials this admission: Vancomycin 4/22 >>5/6? Dapto -starting pending VL down   Microbiology results: 5/7 BCx: NGTD 4/22 Lumbar wound: MRSA 4/22 Disc Space: MRSA 4/21 BCx: negative  Thank you for allowing pharmacy to be a part of this patient's care.   Jimmy Footman, PharmD, BCPS, BCIDP Infectious Diseases Clinical Pharmacist Phone: [phone] Please refer to [facility] for [place] numbers 01/01/2019

## 2019-01-01 NOTE — Progress Notes (Signed)
PROGRESS NOTE    Thomas Simpson  WUJ:[id number] DOB: 02-Dec-1960 DOA: 12/28/2018 PCP: [facility]   Brief Narrative:  Thomas Simpson is an 58 y.o. male withhistory of recent surgery for L3-L4 discitis epidural abscess was discharged home 2 weeks ago after surgery on vancomycin which is to be taken up to February 06, 2019 per the discharge summary has not been feeling well last 4 to 5 days with increasing weakness poor appetite. Found to have AKI.    Assessment & Plan:   Principal Problem:   ARF (acute renal failure) (HCC) Active Problems:   Lumbar discitis   Normochromic normocytic anemia   Hypokalemia   Tobacco abuse   Leukocytosis   Dehydration   AKI without history of CKD - POA, improving minimally -baseline creatinine <1, peaked at 3.04 -  2.60 today -FENA suggestive of intrinsic cause -Suspected due to prolonged IV vanc -Supratherapeutic vanc on admission - 58 -> today 23.  Pharmacy planning to dose dapto when vancomycin clears. - UA was bland  -No renal US, will follow this today -Creatinine improving very slowly, will continue to follow with gentle hydration -consider renal consult if not continuing to improve -strict I/Os -continue IVF - NS @75cc  continuously - follow for volume overload  Status post lumbar laminectomy 12/12/18 for sepsis/discitis involving L3-L4 with lumbar epidural abscess 2/2 MRSA Ongoing leukocytosis -Previously discharged on vancomycin -Given AKI and elevated trough levels pharmacy and ID were consulted, patient changed to daptomycin (not started yet as random vanc levels still supratherapeutic) per their expertise- ABX stop date tentatively 02/06/19 - Patient has PICC line in RUE from previous hosplitalization -Questionable leukocytosis elevated due to recent methylprednisone -ID following we appreciate insight and recs - has f/up appt on 01/03/19 with Dr. Prince Rome  Anemia, subacute, normocytic -Patient had recent surgery,  ongoing infection, and AKI - all likely compounding -Continue to follow - no overt sources of bleeding - hold off on further workup given no need for transfusion and patient not symptomatic  Hypokalemia, ongoing - replace, follow  Tobacco abuse -continue to encourage cessation  DVT prophylaxis: lovenox Code Status: full  Family Communication: none at bedside Disposition Plan: pending further improvement in renal function  Consultants:   ID  Procedures:   none  Antimicrobials:  Anti-infectives (From admission, onward)   None         Subjective: Asking when discharge may be.  Objective: Vitals:   12/31/18 1820 12/31/18 1948 01/01/19 0338 01/01/19 0948  BP: 132/81 140/87 (!) 151/81 (!) 145/76  Pulse: 78 80 76 79  Resp: 18 16 16 18   Temp: 98.5 F (36.9 C) 98.5 F (36.9 C) 98.9 F (37.2 C) 98.4 F (36.9 C)  TempSrc: Oral Oral Oral Oral  SpO2: 98% 98% 96% 98%  Weight:   75.7 kg   Height:        Intake/Output Summary (Last 24 hours) at 01/01/2019 1606 Last data filed at 01/01/2019 1100 Gross per 24 hour  Intake 1361.25 ml  Output 3150 ml  Net -1788.75 ml   Filed Weights   12/30/18 2230 12/31/18 0300 01/01/19 0338  Weight: 76.2 kg 76.2 kg 75.7 kg    Examination:  General: No acute distress. Cardiovascular: Heart sounds show Aristidis Talerico regular rate, and rhythm. Lungs: Clear to auscultation bilaterally  Abdomen: Soft, nontender, nondistended  Neurological: Alert and oriented 3. Moves all extremities 4. Cranial nerves II through XII grossly intact. Skin: Warm and dry. No rashes or lesions. Extremities: No clubbing or  cyanosis. No edema.  Psychiatric: Mood and affect are normal. Insight and judgment are appropriate.   Data Reviewed: I have personally reviewed following labs and imaging studies  CBC: Recent Labs  Lab 12/28/18 1858 12/29/18 0255 12/30/18 0436 12/31/18 0448 01/01/19 0415  WBC 19.4* 18.7* 13.2* 12.7* 15.6*  NEUTROABS  --  15.3*  --   --    --   HGB 12.5* 10.6* 9.9* 10.1* 11.1*  HCT 36.2* 31.6* 29.7* 30.0* 32.7*  MCV 86.0 87.1 87.4 87.2 86.3  PLT 429* 371 404* 452* 549*   Basic Metabolic Panel: Recent Labs  Lab 12/28/18 1858 12/29/18 0830 12/30/18 0436 12/31/18 0448 01/01/19 0415  NA 135 142 141 141 138  K 3.3* 3.4* 3.1* 3.2* 3.1*  CL 99 107 106 102 97*  CO2 22 23 25 27 24   GLUCOSE 165* 145* 102* 106* 115*  BUN 17 18 18 18 17   CREATININE 3.05* 3.04* 2.93* 2.87* 2.60*  CALCIUM 8.3* 8.1* 8.1* 8.3* 8.7*   GFR: Estimated Creatinine Clearance: 32.8 mL/min (Miu Chiong) (by C-G formula based on SCr of 2.6 mg/dL (H)). Liver Function Tests: Recent Labs  Lab 12/28/18 1858 12/29/18 0255  AST 23 18  ALT 27 24  ALKPHOS 125 106  BILITOT 0.7 0.5  PROT 6.9 5.9*  ALBUMIN 2.5* 2.1*   No results for input(s): LIPASE, AMYLASE in the last 168 hours. No results for input(s): AMMONIA in the last 168 hours. Coagulation Profile: No results for input(s): INR, PROTIME in the last 168 hours. Cardiac Enzymes: Recent Labs  Lab 12/31/18 0448  CKTOTAL 14*   BNP (last 3 results) No results for input(s): PROBNP in the last 8760 hours. HbA1C: No results for input(s): HGBA1C in the last 72 hours. CBG: No results for input(s): GLUCAP in the last 168 hours. Lipid Profile: No results for input(s): CHOL, HDL, LDLCALC, TRIG, CHOLHDL, LDLDIRECT in the last 72 hours. Thyroid Function Tests: No results for input(s): TSH, T4TOTAL, FREET4, T3FREE, THYROIDAB in the last 72 hours. Anemia Panel: No results for input(s): VITAMINB12, FOLATE, FERRITIN, TIBC, IRON, RETICCTPCT in the last 72 hours. Sepsis Labs: No results for input(s): PROCALCITON, LATICACIDVEN in the last 168 hours.  Recent Results (from the past 240 hour(s))  Blood culture (routine x 2)     Status: None (Preliminary result)   Collection Time: 12/28/18 10:15 PM  Result Value Ref Range Status   Specimen Description BLOOD LEFT ARM  Final   Special Requests   Final    BOTTLES  DRAWN AEROBIC AND ANAEROBIC Blood Culture adequate volume   Culture   Final    NO GROWTH 4 DAYS Performed at [facility], [address], [place], [place] [address]    Report Status PENDING  Incomplete  Blood culture (routine x 2)     Status: None (Preliminary result)   Collection Time: 12/28/18 10:18 PM  Result Value Ref Range Status   Specimen Description BLOOD RIGHT FOREARM  Final   Special Requests   Final    BOTTLES DRAWN AEROBIC AND ANAEROBIC Blood Culture adequate volume   Culture   Final    NO GROWTH 4 DAYS Performed at [facility], [place] [address], [place], [place] [address]    Report Status PENDING  Incomplete  SARS Coronavirus 2 (CEPHEID - Performed in [place] hospital lab), Hosp Order     Status: None   Collection Time: 12/28/18 11:20 PM  Result Value Ref Range Status   SARS Coronavirus 2 NEGATIVE NEGATIVE  Final    Comment: (NOTE) If result is NEGATIVE SARS-CoV-2 target nucleic acids are NOT DETECTED. The SARS-CoV-2 RNA is generally detectable in upper and lower  respiratory specimens during the acute phase of infection. The lowest  concentration of SARS-CoV-2 viral copies this assay can detect is 250  copies / mL. Kashlynn Kundert negative result does not preclude SARS-CoV-2 infection  and should not be used as the sole basis for treatment or other  patient management decisions.  Darbi Chandran negative result may occur with  improper specimen collection / handling, submission of specimen other  than nasopharyngeal swab, presence of viral mutation(s) within the  areas targeted by this assay, and inadequate number of viral copies  (<250 copies / mL). Zamya Culhane negative result must be combined with clinical  observations, patient history, and epidemiological information. If result is POSITIVE SARS-CoV-2 target nucleic acids are DETECTED. The SARS-CoV-2 RNA is generally detectable in upper and lower  respiratory specimens dur ing the acute phase of infection.  Positive  results are  indicative of active infection with SARS-CoV-2.  Clinical  correlation with patient history and other diagnostic information is  necessary to determine patient infection status.  Positive results do  not rule out bacterial infection or co-infection with other viruses. If result is PRESUMPTIVE POSTIVE SARS-CoV-2 nucleic acids MAY BE PRESENT.   Daniel Johndrow presumptive positive result was obtained on the submitted specimen  and confirmed on repeat testing.  While 2019 novel coronavirus  (SARS-CoV-2) nucleic acids may be present in the submitted sample  additional confirmatory testing may be necessary for epidemiological  and / or clinical management purposes  to differentiate between  SARS-CoV-2 and other Sarbecovirus currently known to infect humans.  If clinically indicated additional testing with an alternate test  methodology [phone]) is advised. The SARS-CoV-2 RNA is generally  detectable in upper and lower respiratory sp ecimens during the acute  phase of infection. The expected result is Negative. Fact Sheet for Patients:  StrictlyIdeas.no Fact Sheet for Healthcare Providers: BankingDealers.co.za This test is not yet approved or cleared by the [place] FDA and has been authorized for detection and/or diagnosis of SARS-CoV-2 by FDA under an Emergency Use Authorization (EUA).  This EUA will remain in effect (meaning this test can be used) for the duration of the COVID-19 declaration under Section 564(b)(1) of the Act, 21 U.S.C. section 360bbb-3(b)(1), unless the authorization is terminated or revoked sooner. Performed at [facility], [place] [address] [place], [place] [address]          Radiology Studies: US Renal  Result Date: 01/01/2019 CLINICAL DATA:  Acute kidney injury. EXAM: RENAL / URINARY TRACT ULTRASOUND COMPLETE COMPARISON:  None. FINDINGS: Right Kidney: Renal measurements: 14.0 x 6.7 x 6.0 cm = volume: 294 mL.  Diffusely increased renal parenchymal echogenicity. No mass or hydronephrosis visualized. Left Kidney: Renal measurements: 10.4 x 6.3 x 6.2 cm = volume: 215 mL. Diffusely increased renal parenchymal echogenicity. No mass or hydronephrosis visualized. Bladder: Appears normal for degree of bladder distention. IMPRESSION: Findings of nonspecific medical renal disease. No evidence of hydronephrosis or renal mass. Electronically Signed   By: Earle Gell M.D.   On: 01/01/2019 07:53        Scheduled Meds: . enoxaparin (LOVENOX) injection  30 mg Subcutaneous Q24H  . feeding supplement (ENSURE ENLIVE)  237 mL Oral BID BM  . multivitamin with minerals  1 tablet Oral Daily  . pantoprazole  40 mg Oral Daily  . sodium chloride flush  10-40 mL  Intracatheter Q12H  . vitamin C  500 mg Oral QPC breakfast   Continuous Infusions: . sodium chloride 75 mL/hr at 01/01/19 0844     LOS: 3 days    Time spent: over 30 min    Fayrene Helper, MD [facility] Pager AMION  If 7PM-7AM, please contact night-coverage [email or web address] Password [facility] 01/01/2019, 4:06 PM

## 2019-01-02 LAB — CULTURE, BLOOD (ROUTINE X 2)
Culture: NO GROWTH
Culture: NO GROWTH
Special Requests: ADEQUATE
Special Requests: ADEQUATE

## 2019-01-02 LAB — CBC
HCT: 31.2 % — ABNORMAL LOW (ref 39.0–52.0)
Hemoglobin: 10.5 g/dL — ABNORMAL LOW (ref 13.0–17.0)
MCH: 29.2 pg (ref 26.0–34.0)
MCHC: 33.7 g/dL (ref 30.0–36.0)
MCV: 86.9 fL (ref 80.0–100.0)
Platelets: 475 10*3/uL — ABNORMAL HIGH (ref 150–400)
RBC: 3.59 MIL/uL — ABNORMAL LOW (ref 4.22–5.81)
RDW: 13.5 % (ref 11.5–15.5)
WBC: 16.1 10*3/uL — ABNORMAL HIGH (ref 4.0–10.5)
nRBC: 0 % (ref 0.0–0.2)

## 2019-01-02 LAB — BASIC METABOLIC PANEL
Anion gap: 18 — ABNORMAL HIGH (ref 5–15)
BUN: 17 mg/dL (ref 6–20)
CO2: 24 mmol/L (ref 22–32)
Calcium: 8.8 mg/dL — ABNORMAL LOW (ref 8.9–10.3)
Chloride: 96 mmol/L — ABNORMAL LOW (ref 98–111)
Creatinine, Ser: 2.61 mg/dL — ABNORMAL HIGH (ref 0.61–1.24)
GFR calc Af Amer: 30 mL/min — ABNORMAL LOW (ref >60)
GFR calc non Af Amer: 26 mL/min — ABNORMAL LOW (ref >60)
Glucose, Bld: 95 mg/dL (ref 70–99)
Potassium: 3.4 mmol/L — ABNORMAL LOW (ref 3.5–5.1)
Sodium: 138 mmol/L (ref 135–145)

## 2019-01-02 LAB — MAGNESIUM: Magnesium: 1.2 mg/dL — ABNORMAL LOW (ref 1.7–2.4)

## 2019-01-02 LAB — VANCOMYCIN, RANDOM: Vancomycin Rm: 15

## 2019-01-02 MED ORDER — ENOXAPARIN SODIUM 40 MG/0.4ML ~~LOC~~ SOLN
40.0000 mg | SUBCUTANEOUS | Status: DC
  Administered 2019-01-03: 40 mg via SUBCUTANEOUS
  Filled 2019-01-02 (×2): qty 0.4

## 2019-01-02 MED ORDER — SODIUM CHLORIDE 0.9 % IV SOLN
600.0000 mg | Freq: Every day | INTRAVENOUS | Status: DC
  Administered 2019-01-02: 600 mg via INTRAVENOUS
  Filled 2019-01-02 (×3): qty 12

## 2019-01-02 MED ORDER — SODIUM CHLORIDE 0.9 % IV SOLN
600.0000 mg | Freq: Every day | INTRAVENOUS | Status: DC
  Filled 2019-01-02: qty 12

## 2019-01-02 NOTE — TOC Initial Note (Signed)
Transition of Care [facility]) - Initial/Assessment Note    Patient Details  Name: Thomas Simpson MRN: [id number] Date of Birth: [birth year]  Transition of Care [facility]) CM/SW Contact:    Bartholomew Crews, RN Phone Number: [phone] 01/02/2019, 1:24 PM  Clinical Narrative:                 PTA home with wife receiving [place] services for RN, PT, and OT from [facility] and IV infusion services from [facility]. At discharge patient will need [place] orders for RN, PT, and OT for resumption of services. CM to follow for transition of care needs.   Expected Discharge Plan: [place] Barriers to Discharge: Continued Medical Work up   Patient Goals and CMS Choice   CMS Medicare.gov Compare Post Acute Care list provided to:: Patient Choice offered to / list presented to : Patient  Expected Discharge Plan and Services Expected Discharge Plan: [place] In-house Referral: NA Discharge Planning Services: CM Consult Post Acute Care Choice: Resumption of Svcs/PTA Provider                   DME Arranged: N/A DME Agency: NA       HH Arranged: RN, OT, PT [place] Agency: [place] ([facility]) Date HH Agency Contacted: 01/02/19 Time HH Agency Contacted: 63 Representative spoke with at [place]: Butch Penny  Prior Living Arrangements/Services   Lives with:: Self, Spouse Patient language and need for interpreter reviewed:: Yes        Need for Family Participation in Patient Care: Yes (Comment) Care giver support system in place?: Yes (comment) Current home services: DME, Home OT, Home PT, Home RN Criminal Activity/Legal Involvement Pertinent to Current Situation/Hospitalization: No - Comment as needed  Activities of Daily Living Home Assistive Devices/Equipment: Eyeglasses, Environmental consultant (specify type) ADL Screening (condition at time of admission) Patient's cognitive ability adequate to safely complete daily activities?: Yes Is the patient deaf or have difficulty  hearing?: No Does the patient have difficulty seeing, even when wearing glasses/contacts?: No Does the patient have difficulty concentrating, remembering, or making decisions?: No Patient able to express need for assistance with ADLs?: Yes Does the patient have difficulty dressing or bathing?: No Independently performs ADLs?: Yes (appropriate for developmental age) Does the patient have difficulty walking or climbing stairs?: Yes Weakness of Legs: Both Weakness of Arms/Hands: None  Permission Sought/Granted   Permission granted to share information with : Yes, Verbal Permission Granted  Share Information with NAME: Mickel Baas     Permission granted to share info w Relationship: wife  Permission granted to share info w Contact Information: [phone]  Emotional Assessment Appearance:: Appears stated age Attitude/Demeanor/Rapport: Engaged Affect (typically observed): Accepting Orientation: : Oriented to Self, Oriented to  Time, Oriented to Place, Oriented to Situation   Psych Involvement: No (comment)  Admission diagnosis:  Dehydration [E86.0] Lumbar discitis [M46.46] AKI (acute kidney injury) ([place]) [N17.9] ARF (acute renal failure) ([place]) [N17.9] Patient Active Problem List   Diagnosis Date Noted  . Hypokalemia 12/30/2018  . Tobacco abuse 12/30/2018  . Leukocytosis 12/30/2018  . Dehydration   . ARF (acute renal failure) ([place]) 12/29/2018  . Normochromic normocytic anemia 12/29/2018  . Lumbar discitis 12/13/2018  . Osteomyelitis ([place]) 12/12/2018   PCP:  [facility], [place]:   [id number] [place], [place] [address] [facility] [place] [address] Phone: [phone] Fax: [phone]     Social Determinants of Health (SDOH) Interventions  Readmission Risk Interventions No flowsheet data found.

## 2019-01-02 NOTE — Progress Notes (Signed)
PHARMACY CONSULT NOTE FOR:  OUTPATIENT  PARENTERAL ANTIBIOTIC THERAPY (OPAT)  Indication: MRSA discitis and abscess Regimen: Daptomycin 600 mg Q 24 hours End date: 02/06/2019  IV antibiotic discharge orders are pended. To discharging provider:  please sign these orders via discharge navigator,  Select New Orders & click on the button choice - Manage This Unsigned Work.     Thank you for allowing pharmacy to be a part of this patient's care.  Jimmy Footman, PharmD, BCPS, BCIDP Infectious Diseases Clinical Pharmacist Phone: [phone] 01/02/2019, 10:39 AM

## 2019-01-02 NOTE — TOC Benefit Eligibility Note (Signed)
Transition of Care [facility]) Benefit Eligibility Note    Patient Details  Name: Thomas Simpson MRN: [id number] Date of Birth: 09-Jun-1961   Medication/Dose: DAPTOMYCIN ( CUBIN ) 500 MG  DAILY IV  Covered?: Yes     Prescription Coverage [facility]: [facility]  Spoke with Person/Company/Phone Number:: Adonis Huguenin  ([facility] # [phone] OPT- MEMBER)  Co-Pay: $ 10.00  Prior Approval: No  Deductible: Met       Memory Argue Phone Number: 01/02/2019, 12:10 PM

## 2019-01-02 NOTE — Progress Notes (Signed)
Pharmacy Antibiotic Note  Thomas Simpson is a 58 y.o. male admitted on 12/28/2018 with AKI in the setting of supratherapeutic vancomycin levels. Pt was discharged on 4/22 on home vancomycin for MRSA discitis/osteomyelitis. SCr on admit 3 mg/dl (baseline ~0.8 mg/dl) and  vancomycin random was 58 mcg/ml (~36 hrs post last dose?). Pharmacy consulted for daptomycin dosing.   Vancomycin random level this morning is 15. Will proceed with daptomycin. Baseline CK was noted to  Be 14 on 5/10.   Plan: - Daptomycin  600 mg (~ 8 mg/kg) every 24 hours  - Monitor renal function and weekly CK  - Will f/u outpatient cost for OPAT orders   Height: 5\' 10"  (177.8 cm) Weight: 166 lb 3.6 oz (75.4 kg) IBW/kg (Calculated) : 73  Temp (24hrs), Avg:98.9 F (37.2 C), Min:98.4 F (36.9 C), Max:99.7 F (37.6 C)  Recent Labs  Lab 12/28/18 2354 12/29/18 0255 12/29/18 0830  12/30/18 0436 12/31/18 0448 01/01/19 0415 01/02/19 0409  WBC  --  18.7*  --   --  13.2* 12.7* 15.6* 16.1*  CREATININE  --   --  3.04*  --  2.93* 2.87* 2.60* 2.61*  VANCOTROUGH 58*  --   --   --   --   --   --   --   VANCORANDOM  --   --   --    < > 38 31 23 15    < > = values in this interval not displayed.    Estimated Creatinine Clearance: 32.6 mL/min (A) (by C-G formula based on SCr of 2.61 mg/dL (H)).    No Known Allergies  Antimicrobials this admission: Vancomycin 4/22 >>5/6? Dapto 5/12>>  Microbiology results: 5/7 BCx: NGTD 4/22 Lumbar wound: MRSA 4/22 Disc Space: MRSA 4/21 BCx: negative  Thank you for allowing pharmacy to be a part of this patient's care.   Jimmy Footman, PharmD, BCPS, BCIDP Infectious Diseases Clinical Pharmacist Phone: [phone] Please refer to [facility] for [place] numbers 01/02/2019

## 2019-01-02 NOTE — Progress Notes (Signed)
PROGRESS NOTE    Thomas Simpson  PFX:[id number] DOB: 1960-10-09 DOA: 12/28/2018 PCP: [facility]   Brief Narrative:  Thomas Simpson is an 57 y.o. male withhistory of recent surgery for L3-L4 discitis epidural abscess was discharged home 2 weeks ago after surgery on vancomycin which is to be taken up to February 06, 2019 per the discharge summary has not been feeling well last 4 to 5 days with increasing weakness poor appetite. Found to have AKI.   Found to have AKI presumed 2/2 vancomycin toxicity in setting of supratherapeutic levels.  Now on dapto.  Creatine slowly improving.   Assessment & Plan:   Principal Problem:   ARF (acute renal failure) (HCC) Active Problems:   Lumbar discitis   Normochromic normocytic anemia   Hypokalemia   Tobacco abuse   Leukocytosis   Dehydration   AKI without history of CKD - POA, improving minimally -baseline creatinine <1, peaked at 3.04 -  2.60 yesterday -> 2.61 today, discussed possibility of nephrology c/s with pt today, if creatinine not improving tomorrow AM, would c/s nephrology in AM.  Regardless, will need outpatient nephrology f/u -FENA suggestive of intrinsic cause -Suspected due to prolonged IV vanc -Supratherapeutic vanc on admission - 58 -> today 23.  Pharmacy planning to dose dapto when vancomycin clears (started today) - UA was bland  - nonspecific medical renal disease -Creatinine improving very slowly, will continue to follow with gentle hydration -consider renal consult if not continuing to improve -strict I/Os - he has good UOP -continue IVF - NS @100cc  continuously - follow for volume overload  Status post lumbar laminectomy 12/12/18 for sepsis/discitis involving L3-L4 with lumbar epidural abscess 2/2 MRSA Ongoing leukocytosis -Previously discharged on vancomycin -Given AKI and elevated trough levels pharmacy and ID were consulted, patient changed to daptomycin per their expertise- ABX stop date  tentatively 02/06/19 - Patient has PICC line in RUE from previous hosplitalization -Questionable leukocytosis elevated due to recent methylprednisone -ID following we appreciate insight and recs - has f/up appt on 01/03/19 with Dr. Prince Rome  Anemia, subacute, normocytic -Patient had recent surgery, ongoing infection, and AKI - all likely compounding -Continue to follow - no overt sources of bleeding - hold off on further workup given no need for transfusion and patient not symptomatic  Hypokalemia, ongoing - replace, follow  Tobacco abuse -continue to encourage cessation  DVT prophylaxis: lovenox Code Status: full  Family Communication: none at bedside, wife on facetime on pt phone Disposition Plan: pending further improvement in renal function  Consultants:   ID  Procedures:   none  Antimicrobials:  Anti-infectives (From admission, onward)   Start     Dose/Rate Route Frequency Ordered Stop   01/02/19 2000  DAPTOmycin (CUBICIN) 600 mg in sodium chloride 0.9 % IVPB  Status:  Discontinued     600 mg 224 mL/hr over 30 Minutes Intravenous Daily 01/02/19 0830 01/02/19 1315   01/02/19 1500  DAPTOmycin (CUBICIN) 600 mg in sodium chloride 0.9 % IVPB     600 mg 224 mL/hr over 30 Minutes Intravenous Daily 01/02/19 1315           Subjective: Really wants to go home.  Objective: Vitals:   01/01/19 2232 01/02/19 0546 01/02/19 1006 01/02/19 1823  BP: 140/85 (!) 143/86 133/81 (!) 146/77  Pulse: 87 90 86 92  Resp: 20 20 18 18   Temp: 99.7 F (37.6 C) 98.7 F (37.1 C) 98 F (36.7 C) 99.9 F (37.7 C)  TempSrc: Oral Oral Oral  Oral  SpO2: 98% 96% 98% 98%  Weight: 75.4 kg     Height:        Intake/Output Summary (Last 24 hours) at 01/02/2019 1932 Last data filed at 01/02/2019 1700 Gross per 24 hour  Intake 3146.25 ml  Output 3875 ml  Net -728.75 ml   Filed Weights   12/31/18 0300 01/01/19 0338 01/01/19 2232  Weight: 76.2 kg 75.7 kg 75.4 kg    Examination:  General:  No acute distress. Cardiovascular: Heart sounds show a regular rate, and rhythm.  Lungs: Clear to auscultation bilaterally Abdomen: Soft, nontender, nondistended  Neurological: Alert and oriented 3. Moves all extremities 4. Cranial nerves II through XII grossly intact. Skin: Warm and dry. No rashes or lesions. Extremities: No clubbing or cyanosis. No edema.  Psychiatric: Mood and affect are normal. Insight and judgment are appropriate.   Data Reviewed: I have personally reviewed following labs and imaging studies  CBC: Recent Labs  Lab 12/29/18 0255 12/30/18 0436 12/31/18 0448 01/01/19 0415 01/02/19 0409  WBC 18.7* 13.2* 12.7* 15.6* 16.1*  NEUTROABS 15.3*  --   --   --   --   HGB 10.6* 9.9* 10.1* 11.1* 10.5*  HCT 31.6* 29.7* 30.0* 32.7* 31.2*  MCV 87.1 87.4 87.2 86.3 86.9  PLT 371 404* 452* 499* 076*   Basic Metabolic Panel: Recent Labs  Lab 12/29/18 0830 12/30/18 0436 12/31/18 0448 01/01/19 0415 01/02/19 0409  NA 142 141 141 138 138  K 3.4* 3.1* 3.2* 3.1* 3.4*  CL 107 106 102 97* 96*  CO2 23 25 27 24 24   GLUCOSE 145* 102* 106* 115* 95  BUN 18 18 18 17 17   CREATININE 3.04* 2.93* 2.87* 2.60* 2.61*  CALCIUM 8.1* 8.1* 8.3* 8.7* 8.8*  MG  --   --   --   --  1.2*   GFR: Estimated Creatinine Clearance: 32.6 mL/min (A) (by C-G formula based on SCr of 2.61 mg/dL (H)). Liver Function Tests: Recent Labs  Lab 12/28/18 1858 12/29/18 0255  AST 23 18  ALT 27 24  ALKPHOS 125 106  BILITOT 0.7 0.5  PROT 6.9 5.9*  ALBUMIN 2.5* 2.1*   No results for input(s): LIPASE, AMYLASE in the last 168 hours. No results for input(s): AMMONIA in the last 168 hours. Coagulation Profile: No results for input(s): INR, PROTIME in the last 168 hours. Cardiac Enzymes: Recent Labs  Lab 12/31/18 0448  CKTOTAL 14*   BNP (last 3 results) No results for input(s): PROBNP in the last 8760 hours. HbA1C: No results for input(s): HGBA1C in the last 72 hours. CBG: No results for input(s):  GLUCAP in the last 168 hours. Lipid Profile: No results for input(s): CHOL, HDL, LDLCALC, TRIG, CHOLHDL, LDLDIRECT in the last 72 hours. Thyroid Function Tests: No results for input(s): TSH, T4TOTAL, FREET4, T3FREE, THYROIDAB in the last 72 hours. Anemia Panel: No results for input(s): VITAMINB12, FOLATE, FERRITIN, TIBC, IRON, RETICCTPCT in the last 72 hours. Sepsis Labs: No results for input(s): PROCALCITON, LATICACIDVEN in the last 168 hours.  Recent Results (from the past 240 hour(s))  Blood culture (routine x 2)     Status: None   Collection Time: 12/28/18 10:15 PM  Result Value Ref Range Status   Specimen Description BLOOD LEFT ARM  Final   Special Requests   Final    BOTTLES DRAWN AEROBIC AND ANAEROBIC Blood Culture adequate volume   Culture   Final    NO GROWTH 5 DAYS Performed at [facility], [address] [place], [place] [address]    Report Status 01/02/2019 FINAL  Final  Blood culture (routine x 2)     Status: None   Collection Time: 12/28/18 10:18 PM  Result Value Ref Range Status   Specimen Description BLOOD RIGHT FOREARM  Final   Special Requests   Final    BOTTLES DRAWN AEROBIC AND ANAEROBIC Blood Culture adequate volume   Culture   Final    NO GROWTH 5 DAYS Performed at [facility], [place] [address] [place], [place] [address]    Report Status 01/02/2019 FINAL  Final  SARS Coronavirus 2 (CEPHEID - Performed in [place] hospital lab), Hosp Order     Status: None   Collection Time: 12/28/18 11:20 PM  Result Value Ref Range Status   SARS Coronavirus 2 NEGATIVE NEGATIVE Final    Comment: (NOTE) If result is NEGATIVE SARS-CoV-2 target nucleic acids are NOT DETECTED. The SARS-CoV-2 RNA is generally detectable in upper and lower  respiratory specimens during the acute phase of infection. The lowest  concentration of SARS-CoV-2 viral copies this assay can detect is 250  copies / mL. A negative result does not preclude SARS-CoV-2 infection  and  should not be used as the sole basis for treatment or other  patient management decisions.  A negative result may occur with  improper specimen collection / handling, submission of specimen other  than nasopharyngeal swab, presence of viral mutation(s) within the  areas targeted by this assay, and inadequate number of viral copies  (<250 copies / mL). A negative result must be combined with clinical  observations, patient history, and epidemiological information. If result is POSITIVE SARS-CoV-2 target nucleic acids are DETECTED. The SARS-CoV-2 RNA is generally detectable in upper and lower  respiratory specimens dur ing the acute phase of infection.  Positive  results are indicative of active infection with SARS-CoV-2.  Clinical  correlation with patient history and other diagnostic information is  necessary to determine patient infection status.  Positive results do  not rule out bacterial infection or co-infection with other viruses. If result is PRESUMPTIVE POSTIVE SARS-CoV-2 nucleic acids MAY BE PRESENT.   A presumptive positive result was obtained on the submitted specimen  and confirmed on repeat testing.  While 2019 novel coronavirus  (SARS-CoV-2) nucleic acids may be present in the submitted sample  additional confirmatory testing may be necessary for epidemiological  and / or clinical management purposes  to differentiate between  SARS-CoV-2 and other Sarbecovirus currently known to infect humans.  If clinically indicated additional testing with an alternate test  methodology [phone]) is advised. The SARS-CoV-2 RNA is generally  detectable in upper and lower respiratory sp ecimens during the acute  phase of infection. The expected result is Negative. Fact Sheet for Patients:  StrictlyIdeas.no Fact Sheet for Healthcare Providers: BankingDealers.co.za This test is not yet approved or cleared by the [place] FDA and has been  authorized for detection and/or diagnosis of SARS-CoV-2 by FDA under an Emergency Use Authorization (EUA).  This EUA will remain in effect (meaning this test can be used) for the duration of the COVID-19 declaration under Section 564(b)(1) of the Act, 21 U.S.C. section 360bbb-3(b)(1), unless the authorization is terminated or revoked sooner. Performed at [facility], [place] [address] [place], [place] [address]          Radiology Studies: No results found.      Scheduled Meds: . [START ON 01/03/2019] enoxaparin (LOVENOX) injection  40 mg  Subcutaneous Q24H  . feeding supplement (ENSURE ENLIVE)  237 mL Oral BID BM  . multivitamin with minerals  1 tablet Oral Daily  . pantoprazole  40 mg Oral Daily  . sodium chloride flush  10-40 mL Intracatheter Q12H  . vitamin C  500 mg Oral QPC breakfast   Continuous Infusions: . sodium chloride 75 mL/hr at 01/01/19 2313  . DAPTOmycin (CUBICIN)  IV 600 mg (01/02/19 1508)     LOS: 4 days    Time spent: over 29 min    Fayrene Helper, MD [facility] Pager AMION  If 7PM-7AM, please contact night-coverage [email or web address] Password [facility] 01/02/2019, 7:32 PM

## 2019-01-03 ENCOUNTER — Inpatient Hospital Stay: Payer: 59 | Admitting: Infectious Diseases

## 2019-01-03 LAB — CBC
HCT: 30.6 % — ABNORMAL LOW (ref 39.0–52.0)
Hemoglobin: 10.4 g/dL — ABNORMAL LOW (ref 13.0–17.0)
MCH: 29.5 pg (ref 26.0–34.0)
MCHC: 34 g/dL (ref 30.0–36.0)
MCV: 86.7 fL (ref 80.0–100.0)
Platelets: 443 10*3/uL — ABNORMAL HIGH (ref 150–400)
RBC: 3.53 MIL/uL — ABNORMAL LOW (ref 4.22–5.81)
RDW: 13.8 % (ref 11.5–15.5)
WBC: 19.1 10*3/uL — ABNORMAL HIGH (ref 4.0–10.5)
nRBC: 0 % (ref 0.0–0.2)

## 2019-01-03 LAB — BASIC METABOLIC PANEL
Anion gap: 11 (ref 5–15)
BUN: 16 mg/dL (ref 6–20)
CO2: 26 mmol/L (ref 22–32)
Calcium: 8.7 mg/dL — ABNORMAL LOW (ref 8.9–10.3)
Chloride: 101 mmol/L (ref 98–111)
Creatinine, Ser: 2.47 mg/dL — ABNORMAL HIGH (ref 0.61–1.24)
GFR calc Af Amer: 33 mL/min — ABNORMAL LOW (ref >60)
GFR calc non Af Amer: 28 mL/min — ABNORMAL LOW (ref >60)
Glucose, Bld: 107 mg/dL — ABNORMAL HIGH (ref 70–99)
Potassium: 3.4 mmol/L — ABNORMAL LOW (ref 3.5–5.1)
Sodium: 138 mmol/L (ref 135–145)

## 2019-01-03 MED ORDER — POTASSIUM CHLORIDE CRYS ER 20 MEQ PO TBCR
40.0000 meq | EXTENDED_RELEASE_TABLET | Freq: Once | ORAL | Status: AC
  Administered 2019-01-03: 40 meq via ORAL
  Filled 2019-01-03: qty 2

## 2019-01-03 MED ORDER — DAPTOMYCIN IV (FOR PTA / DISCHARGE USE ONLY): 600.0000 mg | INTRAVENOUS | 0 refills | Status: AC

## 2019-01-03 NOTE — Progress Notes (Signed)
PROGRESS NOTE    BEACHER EVERY  RSW:[id number] DOB: Jan 15, 1961 DOA: 12/28/2018 PCP: [facility]   Brief Narrative:  Thomas Simpson is an 58 y.o. male withhistory of recent surgery for L3-L4 discitis epidural abscess was discharged home 2 weeks ago after surgery on vancomycin which is to be taken up to February 06, 2019 per the discharge summary has not been feeling well last 4 to 5 days with increasing weakness poor appetite. Found to have AKI.   Found to have AKI presumed 2/2 vancomycin toxicity in setting of supratherapeutic levels.  Now on dapto.  Creatine slowly improving.   Assessment & Plan:   Principal Problem:   ARF (acute renal failure) (HCC) Active Problems:   Lumbar discitis   Normochromic normocytic anemia   Hypokalemia   Tobacco abuse   Leukocytosis   Dehydration   AKI without history of CKD, present on admission Patient presented with noted elevated creatinine 3.05.  Recent history of osteomyelitis/discitis L3-L4 on outpatient vancomycin.  Vancomycin level on admission 58 (at home health as high as 74), supratherapeutic and concern for vancomycin induced nephrotoxicity. FeNa suggestive of intrinsic cause.  Urinalysis was bland. --Pharmacy consulted and monitored vancomycin level while inpatient, trended down from 58 15 --Now started on daptomycin --Cr improving; 3.05-->3.04-->2.98-->2.87-->2.60-->2.61-->2.47 --Continue IV fluid hydration with normal saline at 100 mL's per hour --Continue to monitor strict I's and O's --Repeat BMP in the a.m. --If creatinine continues to improve, may be able to discharge home with outpatient follow-up with PCP and may benefit from outpatient nephrology follow-up.  Status post lumbar laminectomy 12/12/18 for sepsis/discitis involving L3-L4 with lumbar epidural abscess 2/2 MRSA Ongoing leukocytosis Patient was previously discharged on vancomycin with antibiotic stop date tentatively 02/06/2019.  Patient with right upper  extremity PICC line in place from previous hospitalization. --Vancomycin was discontinued secondary to AKI as above --ID following, appreciate recommendations --On daptomycin --New outpatient follow-up with infectious disease  Anemia, subacute, normocytic -Patient had recent surgery, ongoing infection, and AKI - all likely compounding -Continue to follow - no overt sources of bleeding - hold off on further workup given no need for transfusion and patient not symptomatic  Hypokalemia, ongoing --replace, follow  Tobacco abuse --continue to encourage cessation  DVT prophylaxis: lovenox Code Status: full  Family Communication: none  Disposition Plan: pending further improvement in renal function, anticipate discharge in 1-2 days  Consultants:   ID  Procedures:   none  Antimicrobials:  Anti-infectives (From admission, onward)   Start     Dose/Rate Route Frequency Ordered Stop   01/03/19 0000  daptomycin (CUBICIN) IVPB     600 mg Intravenous Every 24 hours 01/03/19 1241 02/07/19 2359   01/02/19 2000  DAPTOmycin (CUBICIN) 600 mg in sodium chloride 0.9 % IVPB  Status:  Discontinued     600 mg 224 mL/hr over 30 Minutes Intravenous Daily 01/02/19 0830 01/02/19 1315   01/02/19 1500  DAPTOmycin (CUBICIN) 600 mg in sodium chloride 0.9 % IVPB     600 mg 224 mL/hr over 30 Minutes Intravenous Daily 01/02/19 1315           Subjective: Patient seen and examined at bedside, resting comfortable.  Requests when he can discharge home.  Discussed with him will need to follow renal function at least 1 more day.  Appears frustrated.  No other complaints at this time.  Denies headache, no chest pain, no palpitations, no shortness of breath, no abdominal pain, no weakness, no paresthesias.  No acute events  overnight per nursing staff.  Objective: Vitals:   01/02/19 2133 01/03/19 0516 01/03/19 0932 01/03/19 1645  BP: (!) 128/94 (!) 145/80 (!) 142/72 136/69  Pulse: (!) 101 79 89 83   Resp: 16 18 18 18   Temp: 99.6 F (37.6 C) 98.9 F (37.2 C) 98.4 F (36.9 C) 98.5 F (36.9 C)  TempSrc: Oral Oral Oral Oral  SpO2: 99% 96% 97% 98%  Weight: 75.5 kg     Height:        Intake/Output Summary (Last 24 hours) at 01/03/2019 1652 Last data filed at 01/03/2019 0516 Gross per 24 hour  Intake 1417.5 ml  Output 1300 ml  Net 117.5 ml   Filed Weights   01/01/19 0338 01/01/19 2232 01/02/19 2133  Weight: 75.7 kg 75.4 kg 75.5 kg    Examination:  General: No acute distress. Cardiovascular: Heart sounds show a regular rate, and rhythm.  Lungs: Clear to auscultation bilaterally Abdomen: Soft, nontender, nondistended  Neurological: Alert and oriented 3. Moves all extremities 4. Cranial nerves II through XII grossly intact. Skin: Warm and dry. No rashes or lesions. Extremities: No clubbing or cyanosis. No edema.  Psychiatric: Mood and affect are normal. Insight and judgment are appropriate.   Data Reviewed: I have personally reviewed following labs and imaging studies  CBC: Recent Labs  Lab 12/29/18 0255 12/30/18 0436 12/31/18 0448 01/01/19 0415 01/02/19 0409 01/03/19 0430  WBC 18.7* 13.2* 12.7* 15.6* 16.1* 19.1*  NEUTROABS 15.3*  --   --   --   --   --   HGB 10.6* 9.9* 10.1* 11.1* 10.5* 10.4*  HCT 31.6* 29.7* 30.0* 32.7* 31.2* 30.6*  MCV 87.1 87.4 87.2 86.3 86.9 86.7  PLT 371 404* 452* 499* 475* 626*   Basic Metabolic Panel: Recent Labs  Lab 12/30/18 0436 12/31/18 0448 01/01/19 0415 01/02/19 0409 01/03/19 0430  NA 141 141 138 138 138  K 3.1* 3.2* 3.1* 3.4* 3.4*  CL 106 102 97* 96* 101  CO2 25 27 24 24 26   GLUCOSE 102* 106* 115* 95 107*  BUN 18 18 17 17 16   CREATININE 2.93* 2.87* 2.60* 2.61* 2.47*  CALCIUM 8.1* 8.3* 8.7* 8.8* 8.7*  MG  --   --   --  1.2*  --    GFR: Estimated Creatinine Clearance: 34.5 mL/min (A) (by C-G formula based on SCr of 2.47 mg/dL (H)). Liver Function Tests: Recent Labs  Lab 12/28/18 1858 12/29/18 0255  AST 23 18   ALT 27 24  ALKPHOS 125 106  BILITOT 0.7 0.5  PROT 6.9 5.9*  ALBUMIN 2.5* 2.1*   No results for input(s): LIPASE, AMYLASE in the last 168 hours. No results for input(s): AMMONIA in the last 168 hours. Coagulation Profile: No results for input(s): INR, PROTIME in the last 168 hours. Cardiac Enzymes: Recent Labs  Lab 12/31/18 0448  CKTOTAL 14*   BNP (last 3 results) No results for input(s): PROBNP in the last 8760 hours. HbA1C: No results for input(s): HGBA1C in the last 72 hours. CBG: No results for input(s): GLUCAP in the last 168 hours. Lipid Profile: No results for input(s): CHOL, HDL, LDLCALC, TRIG, CHOLHDL, LDLDIRECT in the last 72 hours. Thyroid Function Tests: No results for input(s): TSH, T4TOTAL, FREET4, T3FREE, THYROIDAB in the last 72 hours. Anemia Panel: No results for input(s): VITAMINB12, FOLATE, FERRITIN, TIBC, IRON, RETICCTPCT in the last 72 hours. Sepsis Labs: No results for input(s): PROCALCITON, LATICACIDVEN in the last 168 hours.  Recent Results (from the past 240 hour(s))  Blood culture (routine x 2)     Status: None   Collection Time: 12/28/18 10:15 PM  Result Value Ref Range Status   Specimen Description BLOOD LEFT ARM  Final   Special Requests   Final    BOTTLES DRAWN AEROBIC AND ANAEROBIC Blood Culture adequate volume   Culture   Final    NO GROWTH 5 DAYS Performed at [facility], [address], [place], [place] [address]    Report Status 01/02/2019 FINAL  Final  Blood culture (routine x 2)     Status: None   Collection Time: 12/28/18 10:18 PM  Result Value Ref Range Status   Specimen Description BLOOD RIGHT FOREARM  Final   Special Requests   Final    BOTTLES DRAWN AEROBIC AND ANAEROBIC Blood Culture adequate volume   Culture   Final    NO GROWTH 5 DAYS Performed at [facility], [place] [address] [place],  [address]    Report Status 01/02/2019 FINAL  Final  SARS Coronavirus 2 (CEPHEID - Performed in [place]  hospital lab), Hosp Order     Status: None   Collection Time: 12/28/18 11:20 PM  Result Value Ref Range Status   SARS Coronavirus 2 NEGATIVE NEGATIVE Final    Comment: (NOTE) If result is NEGATIVE SARS-CoV-2 target nucleic acids are NOT DETECTED. The SARS-CoV-2 RNA is generally detectable in upper and lower  respiratory specimens during the acute phase of infection. The lowest  concentration of SARS-CoV-2 viral copies this assay can detect is 250  copies / mL. A negative result does not preclude SARS-CoV-2 infection  and should not be used as the sole basis for treatment or other  patient management decisions.  A negative result may occur with  improper specimen collection / handling, submission of specimen other  than nasopharyngeal swab, presence of viral mutation(s) within the  areas targeted by this assay, and inadequate number of viral copies  (<250 copies / mL). A negative result must be combined with clinical  observations, patient history, and epidemiological information. If result is POSITIVE SARS-CoV-2 target nucleic acids are DETECTED. The SARS-CoV-2 RNA is generally detectable in upper and lower  respiratory specimens dur ing the acute phase of infection.  Positive  results are indicative of active infection with SARS-CoV-2.  Clinical  correlation with patient history and other diagnostic information is  necessary to determine patient infection status.  Positive results do  not rule out bacterial infection or co-infection with other viruses. If result is PRESUMPTIVE POSTIVE SARS-CoV-2 nucleic acids MAY BE PRESENT.   A presumptive positive result was obtained on the submitted specimen  and confirmed on repeat testing.  While 2019 novel coronavirus  (SARS-CoV-2) nucleic acids may be present in the submitted sample  additional confirmatory testing may be necessary for epidemiological  and / or clinical management purposes  to differentiate between  SARS-CoV-2 and other  Sarbecovirus currently known to infect humans.  If clinically indicated additional testing with an alternate test  methodology [phone]) is advised. The SARS-CoV-2 RNA is generally  detectable in upper and lower respiratory sp ecimens during the acute  phase of infection. The expected result is Negative. Fact Sheet for Patients:  StrictlyIdeas.no Fact Sheet for Healthcare Providers: BankingDealers.co.za This test is not yet approved or cleared by the [place] FDA and has been authorized for detection and/or diagnosis of SARS-CoV-2 by FDA under an Emergency Use Authorization (EUA).  This EUA will remain in effect (meaning this test  can be used) for the duration of the COVID-19 declaration under Section 564(b)(1) of the Act, 21 U.S.C. section 360bbb-3(b)(1), unless the authorization is terminated or revoked sooner. Performed at [facility], [place] [address], [place], [place] [address]          Radiology Studies: No results found.      Scheduled Meds: . enoxaparin (LOVENOX) injection  40 mg Subcutaneous Q24H  . feeding supplement (ENSURE ENLIVE)  237 mL Oral BID BM  . multivitamin with minerals  1 tablet Oral Daily  . pantoprazole  40 mg Oral Daily  . sodium chloride flush  10-40 mL Intracatheter Q12H  . vitamin C  500 mg Oral QPC breakfast   Continuous Infusions: . sodium chloride 100 mL/hr at 01/03/19 0832  . DAPTOmycin (CUBICIN)  IV 600 mg (01/02/19 1508)     LOS: 5 days    Time spent: over 30 min    Brihany Butch J [place] DO [facility] Pager AMION  If 7PM-7AM, please contact night-coverage [email or web address] Password [facility] 01/03/2019, 4:52 PM

## 2019-01-04 DIAGNOSIS — M4640 Discitis, unspecified, site unspecified: Secondary | ICD-10-CM | POA: Diagnosis not present

## 2019-01-04 DIAGNOSIS — M869 Osteomyelitis, unspecified: Secondary | ICD-10-CM | POA: Diagnosis not present

## 2019-01-04 LAB — BASIC METABOLIC PANEL
Anion gap: 13 (ref 5–15)
BUN: 18 mg/dL (ref 6–20)
CO2: 25 mmol/L (ref 22–32)
Calcium: 8.9 mg/dL (ref 8.9–10.3)
Chloride: 100 mmol/L (ref 98–111)
Creatinine, Ser: 2.27 mg/dL — ABNORMAL HIGH (ref 0.61–1.24)
GFR calc Af Amer: 36 mL/min — ABNORMAL LOW (ref >60)
GFR calc non Af Amer: 31 mL/min — ABNORMAL LOW (ref >60)
Glucose, Bld: 114 mg/dL — ABNORMAL HIGH (ref 70–99)
Potassium: 3.6 mmol/L (ref 3.5–5.1)
Sodium: 138 mmol/L (ref 135–145)

## 2019-01-04 LAB — CBC
HCT: 30.5 % — ABNORMAL LOW (ref 39.0–52.0)
Hemoglobin: 10.3 g/dL — ABNORMAL LOW (ref 13.0–17.0)
MCH: 29.2 pg (ref 26.0–34.0)
MCHC: 33.8 g/dL (ref 30.0–36.0)
MCV: 86.4 fL (ref 80.0–100.0)
Platelets: 473 10*3/uL — ABNORMAL HIGH (ref 150–400)
RBC: 3.53 MIL/uL — ABNORMAL LOW (ref 4.22–5.81)
RDW: 13.7 % (ref 11.5–15.5)
WBC: 18.2 10*3/uL — ABNORMAL HIGH (ref 4.0–10.5)
nRBC: 0 % (ref 0.0–0.2)

## 2019-01-04 MED ORDER — HEPARIN SOD (PORK) LOCK FLUSH 100 UNIT/ML IV SOLN
250.0000 [IU] | INTRAVENOUS | Status: AC | PRN
  Administered 2019-01-04: 250 [IU]

## 2019-01-04 NOTE — Progress Notes (Signed)
DISCHARGE NOTE Thomas Simpson to be discharged Home per MD order. Patient verbalized understanding.  Skin clean, dry and intact without evidence of skin break down, no evidence of skin tears noted. Going home with PICC line. Site without signs and symptoms of complications. Dressing and pressure applied.No complaints noted. Discharge packet assembled. An After Visit Summary (AVS) was printed and given to the patientl. Patient escorted via wheelchair and discharged to home via private auto.   Babs Sciara, RN

## 2019-01-04 NOTE — Discharge Summary (Signed)
Physician Discharge Summary  Thomas Simpson TKW:[id number] DOB: 02/04/1961 DOA: 12/28/2018  PCP: [facility]  Admit date: 12/28/2018 Discharge date: 01/04/2019  Admitted From: Home Disposition:  Home  Recommendations for Outpatient Follow-up:  1. Follow up with PCP in 1 week 2. CBC, BMP, CK weekly 3. ESR/CRP every other week 4. Continue follow-up with infectious disease  Home Health: Yes, RN, PT/OT Equipment/Devices: PICC line, initially placed 12/14/2018  Discharge Condition: Stable CODE STATUS: Full code Diet recommendation: Heart Healthy  History of present illness:  Thomas Cordner Rutledgeis an 58 y.o.malewithhistory of recent surgery for L3-L4 discitis epidural abscess was discharged home 2 weeks ago after surgery on vancomycin which is to be taken up to February 06, 2019 per the discharge summary has not been feeling well last 4 to 5 days with increasing weakness poor appetite. Found to have AKI.   Hospital course:  AKI without history of CKD, present on admission Vancomycin toxicity with associated nephrotoxicity Patient presented with noted elevated creatinine 3.05.  Recent history of osteomyelitis/discitis L3-L4 on outpatient vancomycin.  Vancomycin level on admission 58 (at home health as high as 74), supratherapeutic and concern for vancomycin induced nephrotoxicity. FeNa suggestive of intrinsic cause.  Urinalysis was bland.  Pharmacy was consulted on admission and monitored patient's vancomycin level which trended down to 15.  Patient was started on daptomycin due to his nephrotoxicity with creatinine improving to 2.27 at time of discharge.  Recommend continue monitoring of BMP weekly, also with CBC, CK.  ESR/CRP every other week.  Follow-up with PCP.  If creatinine does not return to normal, may need nephrology outpatient consultation.  Status postlumbar laminectomy 12/12/18 forsepsis/discitis involving L3-L4 with lumbar epidural abscess 2/2  MRSA Ongoingleukocytosis Patient was previously discharged on vancomycin with antibiotic stop date tentatively 02/06/2019.  Patient with right upper extremity PICC line in place from previous hospitalization.  Vancomycin was discontinued due to nephrotoxicity as above.  Started on daptomycin.  Continue outpatient follow-up with ID following discharge.  Will need weekly CBC, BMP, CK levels.  Every other week needs ESR/CRP.  Resume home health care.  Tobacco abuse continue to encourage cessation  Discharge Diagnoses:  Principal Problem:   ARF (acute renal failure) (HCC) Active Problems:   Lumbar discitis   Normochromic normocytic anemia   Tobacco abuse   Leukocytosis    Discharge Instructions  Discharge Instructions    Call MD for:  difficulty breathing, headache or visual disturbances   Complete by:  As directed    Call MD for:  persistant dizziness or light-headedness   Complete by:  As directed    Call MD for:  persistant nausea and vomiting   Complete by:  As directed    Call MD for:  severe uncontrolled pain   Complete by:  As directed    Call MD for:  temperature >100.4   Complete by:  As directed    Continue PICC at discharge   Complete by:  As directed    Flush PICC line per home health policy:  Yes   Diet - low sodium heart healthy   Complete by:  As directed    Home infusion instructions [facility] May follow [place] Dosing Protocol; May administer Cathflo as needed to maintain patency of vascular access device.; Flushing of vascular access device: per [facility] Protocol: 0.9% NaCl pre/post medica...   Complete by:  As directed    Instructions:  May follow [facility] Dosing Protocol   Instructions:  May administer Cathflo  as needed to maintain patency of vascular access device.   Instructions:  Flushing of vascular access device: per [facility] Protocol: 0.9% NaCl pre/post medication administration and prn patency; Heparin 100 u/ml, 58m for implanted ports and  Heparin 10u/ml, 576mfor all other central venous catheters.   Instructions:  May follow [facility] Anaphylaxis Protocol for First Dose Administration in the home: 0.9% NaCl at 25-50 ml/hr to maintain IV access for protocol meds. Epinephrine 0.3 ml IV/IM PRN and Benadryl 25-50 IV/IM PRN s/s of anaphylaxis.   Instructions:  [facility] Coord[facility] (RN) to assist per patient IV care needs in the home PRN.   Increase activity slowly   Complete by:  As directed      Allergies as of 01/04/2019   No Known Allergies     Medication List    STOP taking these medications   vancomycin  IVPB     TAKE these medications   cholecalciferol 25 MCG (1000 UT) tablet Commonly known as:  VITAMIN D3 Take 1,000 Units by mouth daily.   daptomycin  IVPB Commonly known as:  CUBICIN Inject 600 mg into the vein daily. Indication:  MRSA discitis and abscess  Last Day of Therapy: 02/06/19 Labs - Once weekly:  CBC/D, BMP, and CPK Labs - Every other week:  ESR and CRP   Dexilant 60 MG capsule Generic drug:  dexlansoprazole Take 60 mg by mouth every morning.   MegaRed Omega-3 Krill Oil 500 MG Caps Take 500 mg elemental calcium/kg/hr by mouth daily with breakfast.   ProAir HFA 108 (90 Base) MCG/ACT inhaler Generic drug:  albuterol Inhale 2 puffs into the lungs every 6 (six) hours as needed for wheezing or shortness of breath.   VITAMIN C GUMMIE PO Take 2 tablets by mouth daily after breakfast.            Home Infusion Instuctions  (From admission, onward)         Start     Ordered   01/03/19 0000  Home infusion instructions [facility] May follow [facility]; May administer Cathflo as needed to maintain patency of vascular access device.; Flushing of vascular access device: [facility]: 0.9% NaCl pre/post medica...    Question Answer Comment  Instructions May follow [facility] Protocol   Instructions May administer Cathflo as needed to maintain  patency of vascular access device.   Instructions Flushing of vascular access device: [facility]: 0.9% NaCl pre/post medication administration and prn patency; Heparin 100 u/ml, 73m70mor implanted ports and Heparin 10u/ml, 73ml19mr all other central venous catheters.   Instructions May follow [facility] Anaphylaxis Protocol for First Dose Administration in the home: 0.9% NaCl at 25-50 ml/hr to maintain IV access for protocol meds. Epinephrine 0.3 ml IV/IM PRN and Benadryl 25-50 IV/IM PRN s/s of anaphylaxis.   Instructions [facility] Infusion Coordinator (RN) to assist per patient IV care needs in the home PRN.      01/03/19 1241         Follow-up Information    [facility] as soon as possible for a visit in 1 week(s).   Specialty:  Internal Medicine Contact information: [phone]      No Known Allergies  Consultations:  Infectious disease   Procedures/Studies: Dg Lumbar Spine 2-3 Views  Result Date: 12/13/2018 CLINICAL DATA:  Epidural abscess EXAM: LUMBAR SPINE - 2-3  VIEW COMPARISON:  Lumbar MRI 12/11/2018 FINDINGS: Lateral localizing imaging of the lumbar spine. Needle is located between the spinous processes of L3 and L4. Normal alignment. IMPRESSION: Localization L3-4 spinous process. Electronically Signed   By: Franchot Gallo M.D.   On: 12/13/2018 15:16   Mr Lumbar Spine W Wo Contrast  Result Date: 12/11/2018 CLINICAL DATA:  Severe low back pain radiating down the left leg for the past 2 months. Prior surgery in August 2019. EXAM: MRI LUMBAR SPINE WITHOUT AND WITH CONTRAST TECHNIQUE: Multiplanar and multiecho pulse sequences of the lumbar spine were obtained without and with intravenous contrast. CONTRAST:  74m MULTIHANCE GADOBENATE DIMEGLUMINE 529 MG/ML IV SOLN COMPARISON:  MR lumbar spine dated September 05, 2018. FINDINGS: Segmentation:  Standard. Alignment: Unchanged mild  dextroscoliosis. Sagittal alignment is maintained. Vertebrae: New osteomyelitis discitis at L3-L4 with fluid and enhancement of the disc space, erosion of the endplates, and confluent marrow edema with decreased T1 marrow signal throughout both L3 and L4 vertebral bodies. Mild right-sided endplate marrow edema and enhancement at L4-L5 is unchanged likely degenerative. No fracture or suspicious bone lesion. Conus medullaris and cauda equina: Conus extends to the L1 level. Conus and cauda equina appear normal. No intradural enhancement. Paraspinal and other soft tissues: Prominent paravertebral inflammatory changes at L3-L4 extending into both psoas muscles with tiny 5 mm rim enhancing fluid collection in the left psoas muscle. There is a larger incompletely visualized 1.6 cm abscess in the inferior left psoas muscle at the level of the sacral ala. Prominent anterior epidural phlegmon extending from the superior L3 endplate to the inferior L4 endplate with small 6 x 3 x 8 mm epidural abscess on the left behind the L3 vertebral body. Disc levels: T12-L1:  Negative. L1-L2:  Negative. L2-L3: Negative disc. New severe left lateral recess stenosis due to epidural phlegmon. No spinal canal or neuroforaminal stenosis. L3-L4: New severe spinal canal and bilateral lateral recess stenosis due to disc bulging and anterior epidural phlegmon. Progressive moderate to severe left and severe right neuroforaminal stenosis. L4-L5: Prior right hemilaminectomy. Slightly decreased enhancing granulation tissue along the right epidural space extending into the right subarticular zone inferiorly, adjacent to the descending right L5 nerve root as it exits the thecal sac. There is a new shallow left subarticular and foraminal disc protrusion. Unchanged mild bilateral facet arthropathy. New mild to moderate left lateral recess stenosis. Unchanged moderate right and mild-to-moderate left neuroforaminal stenosis. Resolved spinal canal stenosis.  L5-S1: Unchanged shallow broad-based posterior disc protrusion with annular fissure. No stenosis. IMPRESSION: 1. New osteomyelitis discitis at L3-L4 with prominent anterior epidural phlegmon and small 6 x 3 x 8 mm epidural abscess resulting in severe spinal canal and lateral recess stenosis with progressive moderate to severe left and severe right neuroforaminal stenosis. Additional new severe left lateral recess stenosis at L2-L3 due to epidural phlegmon. 2. Prominent paravertebral inflammatory changes at L3-L4 with tiny 5 mm abscess in the left psoas muscle at this level and larger, incompletely visualized 1.6 cm abscess in the inferior left psoas muscle at the level of the sacral ala. 3. Decreasing granulation tissue in the surgical bed and right subarticular zone at L4-L5. New shallow left sided disc protrusion at this level with new mild to moderate left lateral recess stenosis. Unchanged moderate right and mild-to-moderate left neuroforaminal stenosis. These results will be called to the ordering clinician or representative by the Radiologist Assistant, and communication documented in the PACS or zVision Dashboard. Electronically Signed   By: WTitus Dubin  M.D.   On: 12/11/2018 16:44   Us Renal  Result Date: 01/01/2019 CLINICAL DATA:  Acute kidney injury. EXAM: RENAL / URINARY TRACT ULTRASOUND COMPLETE COMPARISON:  None. FINDINGS: Right Kidney: Renal measurements: 14.0 x 6.7 x 6.0 cm = volume: 294 mL. Diffusely increased renal parenchymal echogenicity. No mass or hydronephrosis visualized. Left Kidney: Renal measurements: 10.4 x 6.3 x 6.2 cm = volume: 215 mL. Diffusely increased renal parenchymal echogenicity. No mass or hydronephrosis visualized. Bladder: Appears normal for degree of bladder distention. IMPRESSION: Findings of nonspecific medical renal disease. No evidence of hydronephrosis or renal mass. Electronically Signed   By: John  Stahl M.D.   On: 01/01/2019 07:53   Dg Chest Port 1  View  Result Date: 12/28/2018 CLINICAL DATA:  Fever EXAM: PORTABLE CHEST 1 VIEW COMPARISON:  04/13/2004 FINDINGS: Right upper extremity catheter tip over the SVC. No focal airspace disease or pleural effusion. Normal heart size. No pneumothorax. IMPRESSION: No active disease.  Right upper extremity catheter tip over the SVC Electronically Signed   By: Kim  Fujinaga M.D.   On: 12/28/2018 22:42   Vas Us Upper Extremity Venous Duplex  Result Date: 12/31/2018 UPPER VENOUS STUDY  Indications: Erythema Performing Technologist: Jill Parker RDMS, RVT  Examination Guidelines: A complete evaluation includes B-mode imaging, spectral Doppler, color Doppler, and power Doppler as needed of all accessible portions of each vessel. Bilateral testing is considered an integral part of a complete examination. Limited examinations for reoccurring indications may be performed as noted.  Right Findings: +----------+------------+---------+-----------+----------+-------+ RIGHT     CompressiblePhasicitySpontaneousPropertiesSummary +----------+------------+---------+-----------+----------+-------+ IJV           Full       Yes       Yes                      +----------+------------+---------+-----------+----------+-------+ Subclavian    Full       Yes       Yes                      +----------+------------+---------+-----------+----------+-------+ Axillary      Full       Yes       Yes                      +----------+------------+---------+-----------+----------+-------+ Brachial      Full       Yes       Yes                      +----------+------------+---------+-----------+----------+-------+ Radial        Full                                          +----------+------------+---------+-----------+----------+-------+ Ulnar         Full                                          +----------+------------+---------+-----------+----------+-------+ Cephalic      Full                                           +----------+------------+---------+-----------+----------+-------+ Basilic         Full                                          +----------+------------+---------+-----------+----------+-------+  Left Findings: +----------+------------+---------+-----------+----------+-------+ LEFT      CompressiblePhasicitySpontaneousPropertiesSummary +----------+------------+---------+-----------+----------+-------+ Subclavian               Yes       Yes                      +----------+------------+---------+-----------+----------+-------+  Summary:  Right: No evidence of deep vein thrombosis in the upper extremity. No evidence of superficial vein thrombosis in the upper extremity.  Left: No evidence of thrombosis in the subclavian.  *See table(s) above for measurements and observations.  Diagnosing physician: Harold Barban MD Electronically signed by Harold Barban MD on 12/31/2018 at 2:19:44 PM.    Final    [place] Ekg Site Rite  Result Date: 12/14/2018 If Site Rite image not attached, placement could not be confirmed due to current cardiac rhythm.      Subjective: Patient seen and examined at bedside, frustrated that he has not been discharged yet.  Creatinine has slowly trended down, but not baseline.  No other complaints at this point.  Denies headache, no fever/chills/night sweats, no nausea/vomiting/diarrhea, no chest pain, no palpitations, no shortness of breath, no abdominal pain.  No acute events overnight per nursing staff.   Discharge Exam: Vitals:   01/03/19 2128 01/04/19 0617  BP: 130/73 (!) 142/81  Pulse: 89 81  Resp: 18 18  Temp: 98.4 F (36.9 C) 98.4 F (36.9 C)  SpO2: 97% 98%   Vitals:   01/03/19 0932 01/03/19 1645 01/03/19 2128 01/04/19 0617  BP: (!) 142/72 136/69 130/73 (!) 142/81  Pulse: 89 83 89 81  Resp: _0 Temp: 98.4 F (36.9 C) 98.5 F (36.9 C) 98.4 F (36.9 C) 98.4 F (36.9 C)  TempSrc: Oral Oral Oral Oral  SpO2: 97% 98% 97%  98%  Weight:   75.5 kg   Height:        General: Pt is alert, awake, not in acute distress Cardiovascular: RRR, S1/S2 +, no rubs, no gallops Respiratory: CTA bilaterally, no wheezing, no rhonchi Abdominal: Soft, NT, ND, bowel sounds + Extremities: no edema, no cyanosis    The results of significant diagnostics from this hospitalization (including imaging, microbiology, ancillary and laboratory) are listed below for reference.     Microbiology: Recent Results (from the past 240 hour(s))  Blood culture (routine x 2)     Status: None   Collection Time: 12/28/18 10:15 PM  Result Value Ref Range Status   Specimen Description BLOOD LEFT ARM  Final   Special Requests   Final    BOTTLES DRAWN AEROBIC AND ANAEROBIC Blood Culture adequate volume   Culture   Final    NO GROWTH 5 DAYS Performed at [facility], [address] [place], [place] [address]    Report Status 01/02/2019 FINAL  Final  Blood culture (routine x 2)     Status: None   Collection Time: 12/28/18 10:18 PM  Result Value Ref Range Status   Specimen Description BLOOD RIGHT FOREARM  Final   Special Requests   Final    BOTTLES DRAWN AEROBIC AND ANAEROBIC Blood Culture adequate volume   Culture   Final    NO GROWTH 5 DAYS Performed at [facility], [address] [place], [place] [address]    Report Status 01/02/2019 FINAL  Final  SARS Coronavirus 2 (CEPHEID - Performed in [place] hospital lab), Hosp Order     Status: None   Collection Time: 12/28/18 11:20 PM  Result Value Ref Range Status   SARS Coronavirus 2 NEGATIVE NEGATIVE Final    Comment: (NOTE) If result is NEGATIVE SARS-CoV-2 target nucleic acids are NOT DETECTED. The SARS-CoV-2 RNA is generally detectable in upper and lower  respiratory specimens during the acute phase of infection. The lowest  concentration of SARS-CoV-2 viral copies this assay can detect is 250  copies / mL. A negative result does not preclude SARS-CoV-2 infection  and  should not be used as the sole basis for treatment or other  patient management decisions.  A negative result may occur with  improper specimen collection / handling, submission of specimen other  than nasopharyngeal swab, presence of viral mutation(s) within the  areas targeted by this assay, and inadequate number of viral copies  (<250 copies / mL). A negative result must be combined with clinical  observations, patient history, and epidemiological information. If result is POSITIVE SARS-CoV-2 target nucleic acids are DETECTED. The SARS-CoV-2 RNA is generally detectable in upper and lower  respiratory specimens dur ing the acute phase of infection.  Positive  results are indicative of active infection with SARS-CoV-2.  Clinical  correlation with patient history and other diagnostic information is  necessary to determine patient infection status.  Positive results do  not rule out bacterial infection or co-infection with other viruses. If result is PRESUMPTIVE POSTIVE SARS-CoV-2 nucleic acids MAY BE PRESENT.   A presumptive positive result was obtained on the submitted specimen  and confirmed on repeat testing.  While 2019 novel coronavirus  (SARS-CoV-2) nucleic acids may be present in the submitted sample  additional confirmatory testing may be necessary for epidemiological  and / or clinical management purposes  to differentiate between  SARS-CoV-2 and other Sarbecovirus currently known to infect humans.  If clinically indicated additional testing with an alternate test  methodology [phone]) is advised. The SARS-CoV-2 RNA is generally  detectable in upper and lower respiratory sp ecimens during the acute  phase of infection. The expected result is Negative. Fact Sheet for Patients:  StrictlyIdeas.no Fact Sheet for Healthcare Providers: BankingDealers.co.za This test is not yet approved or cleared by the [place] FDA and has been  authorized for detection and/or diagnosis of SARS-CoV-2 by FDA under an Emergency Use Authorization (EUA).  This EUA will remain in effect (meaning this test can be used) for the duration of the COVID-19 declaration under Section 564(b)(1) of the Act, 21 U.S.C. section 360bbb-3(b)(1), unless the authorization is terminated or revoked sooner. Performed at [facility], [place] [address], [place], [place] [address]      Labs: BNP (last 3 results) No results for input(s): BNP in the last 8760 hours. Basic Metabolic Panel: Recent Labs  Lab 12/31/18 0448 01/01/19 0415 01/02/19 0409 01/03/19 0430 01/04/19 0445  NA 141 138 138 138 138  K 3.2* 3.1* 3.4* 3.4* 3.6  CL 102 97* 96* 101 100  CO2 _0 GLUCOSE 106* 115* 95 107* 114*  BUN _1 CREATININE 2.87* 2.60* 2.61* 2.47* 2.27*  CALCIUM 8.3* 8.7* 8.8* 8.7* 8.9  MG  --   --  1.2*  --   --    Liver Function Tests: Recent Labs  Lab  12/28/18 1858 12/29/18 0255  AST 23 18  ALT 27 24  ALKPHOS 125 106  BILITOT 0.7 0.5  PROT 6.9 5.9*  ALBUMIN 2.5* 2.1*   No results for input(s): LIPASE, AMYLASE in the last 168 hours. No results for input(s): AMMONIA in the last 168 hours. CBC: Recent Labs  Lab 12/29/18 0255  12/31/18 0448 01/01/19 0415 01/02/19 0409 01/03/19 0430 01/04/19 0445  WBC 18.7*   < > 12.7* 15.6* 16.1* 19.1* 18.2*  NEUTROABS 15.3*  --   --   --   --   --   --   HGB 10.6*   < > 10.1* 11.1* 10.5* 10.4* 10.3*  HCT 31.6*   < > 30.0* 32.7* 31.2* 30.6* 30.5*  MCV 87.1   < > 87.2 86.3 86.9 86.7 86.4  PLT 371   < > 452* 499* 475* 443* 473*   < > = values in this interval not displayed.   Cardiac Enzymes: Recent Labs  Lab 12/31/18 0448  CKTOTAL 14*   BNP: Invalid input(s): POCBNP CBG: No results for input(s): GLUCAP in the last 168 hours. D-Dimer No results for input(s): DDIMER in the last 72 hours. Hgb A1c No results for input(s): HGBA1C in the last 72 hours. Lipid Profile No  results for input(s): CHOL, HDL, LDLCALC, TRIG, CHOLHDL, LDLDIRECT in the last 72 hours. Thyroid function studies No results for input(s): TSH, T4TOTAL, T3FREE, THYROIDAB in the last 72 hours.  Invalid input(s): FREET3 Anemia work up No results for input(s): VITAMINB12, FOLATE, FERRITIN, TIBC, IRON, RETICCTPCT in the last 72 hours. Urinalysis    Component Value Date/Time   COLORURINE STRAW (A) 12/28/2018 2354   APPEARANCEUR CLEAR 12/28/2018 2354   LABSPEC 1.003 (L) 12/28/2018 2354   PHURINE 6.0 12/28/2018 2354   GLUCOSEU NEGATIVE 12/28/2018 2354   HGBUR SMALL (A) 12/28/2018 2354   BILIRUBINUR NEGATIVE 12/28/2018 2354   KETONESUR NEGATIVE 12/28/2018 2354   PROTEINUR NEGATIVE 12/28/2018 2354   NITRITE NEGATIVE 12/28/2018 2354   LEUKOCYTESUR NEGATIVE 12/28/2018 2354   Sepsis Labs Invalid input(s): PROCALCITONIN,  WBC,  LACTICIDVEN Microbiology Recent Results (from the past 240 hour(s))  Blood culture (routine x 2)     Status: None   Collection Time: 12/28/18 10:15 PM  Result Value Ref Range Status   Specimen Description BLOOD LEFT ARM  Final   Special Requests   Final    BOTTLES DRAWN AEROBIC AND ANAEROBIC Blood Culture adequate volume   Culture   Final    NO GROWTH 5 DAYS Performed at [facility], [address], [place], [place] [address]    Report Status 01/02/2019 FINAL  Final  Blood culture (routine x 2)     Status: None   Collection Time: 12/28/18 10:18 PM  Result Value Ref Range Status   Specimen Description BLOOD RIGHT FOREARM  Final   Special Requests   Final    BOTTLES DRAWN AEROBIC AND ANAEROBIC Blood Culture adequate volume   Culture   Final    NO GROWTH 5 DAYS Performed at [facility], [place] [address] [place], [place] [address]    Report Status 01/02/2019 FINAL  Final  SARS Coronavirus 2 (CEPHEID - Performed in [place] hospital lab), Hosp Order     Status: None   Collection Time: 12/28/18 11:20 PM  Result Value Ref Range Status   SARS  Coronavirus 2 NEGATIVE NEGATIVE Final    Comment: (NOTE) If result is NEGATIVE SARS-CoV-2 target nucleic acids are NOT DETECTED. The  SARS-CoV-2 RNA is generally detectable in upper and lower  respiratory specimens during the acute phase of infection. The lowest  concentration of SARS-CoV-2 viral copies this assay can detect is 250  copies / mL. A negative result does not preclude SARS-CoV-2 infection  and should not be used as the sole basis for treatment or other  patient management decisions.  A negative result may occur with  improper specimen collection / handling, submission of specimen other  than nasopharyngeal swab, presence of viral mutation(s) within the  areas targeted by this assay, and inadequate number of viral copies  (<250 copies / mL). A negative result must be combined with clinical  observations, patient history, and epidemiological information. If result is POSITIVE SARS-CoV-2 target nucleic acids are DETECTED. The SARS-CoV-2 RNA is generally detectable in upper and lower  respiratory specimens dur ing the acute phase of infection.  Positive  results are indicative of active infection with SARS-CoV-2.  Clinical  correlation with patient history and other diagnostic information is  necessary to determine patient infection status.  Positive results do  not rule out bacterial infection or co-infection with other viruses. If result is PRESUMPTIVE POSTIVE SARS-CoV-2 nucleic acids MAY BE PRESENT.   A presumptive positive result was obtained on the submitted specimen  and confirmed on repeat testing.  While 2019 novel coronavirus  (SARS-CoV-2) nucleic acids may be present in the submitted sample  additional confirmatory testing may be necessary for epidemiological  and / or clinical management purposes  to differentiate between  SARS-CoV-2 and other Sarbecovirus currently known to infect humans.  If clinically indicated additional testing with an alternate test   methodology ([id number]) is advised. The SARS-CoV-2 RNA is generally  detectable in upper and lower respiratory sp ecimens during the acute  phase of infection. The expected result is Negative. Fact Sheet for Patients:  https://www.fda.gov/media/136312/download Fact Sheet for Healthcare Providers: https://www.fda.gov/media/136313/download This test is not yet approved or cleared by the [place] FDA and has been authorized for detection and/or diagnosis of SARS-CoV-2 by FDA under an Emergency Use Authorization (EUA).  This EUA will remain in effect (meaning this test can be used) for the duration of the COVID-19 declaration under Section 564(b)(1) of the Act, 21 U.S.C. section 360bbb-3(b)(1), unless the authorization is terminated or revoked sooner. Performed at [facility], [address] [place], [place] [address]      Time coordinating discharge: Over 30 minutes  SIGNED:   Eric J Austria, DO  [facility] 01/04/2019, 8:56 AM   

## 2019-01-05 ENCOUNTER — Telehealth: Payer: Self-pay | Admitting: Infectious Diseases

## 2019-01-05 DIAGNOSIS — Z4789 Encounter for other orthopedic aftercare: Secondary | ICD-10-CM | POA: Diagnosis not present

## 2019-01-05 DIAGNOSIS — G061 Intraspinal abscess and granuloma: Secondary | ICD-10-CM | POA: Diagnosis not present

## 2019-01-05 DIAGNOSIS — M4646 Discitis, unspecified, lumbar region: Secondary | ICD-10-CM | POA: Diagnosis not present

## 2019-01-05 NOTE — Telephone Encounter (Signed)
Spoke with [facility] pharmacy, confirmed they have the orders. RN called patient's wife Mickel Baas, relayed directions to her.  She will give him benadryl per package instructions as needed, giving him one 30-45 minutes before antibiotic administration.  She will slow the push from "2 lines every minute to 1 line every minute" (it sounds like she is pushing a prefilled syringe). Added [place] to Dr Prince Rome' schedule on 5/20.  Sent mychart activation to [employer] per her request. She will send [place] pictures. Landis Gandy, RN

## 2019-01-05 NOTE — Telephone Encounter (Signed)
Notified by Carolynn Sayers that his wife called to report "a full body, head to toe rash" today. They report no SOB/dyspnea, involvement on mouth/eyes/hands. No other symptoms to report.   I attempted to reach his wife to discuss further--left a voicemail to return call.   Please call [place] with the following orders: 1. With next lab draw please add LFTs and Differential to his CBC  2. Please have the patient pre-medicate with 25 mg benadryl 30-45 minutes prior to his infusion. Can also take per box instructions as needed outside of this for itching.   3. Please slow down infusion and have them push their syringe over 20 minutes instead of 10 minutes.    Please try to reach Mr./Mrs. Akhtar again - would like to try to set them up with MyChart to help them send photos to MyChart to share with Dr. Prince Rome.   Would like to also push his appointment up with Dr. Prince Rome to next week if they are able to make this.     Verified dose and correct for indication and current creatinine clearance 21mL/min.   Thank you kindly.

## 2019-01-05 NOTE — Telephone Encounter (Signed)
So awesome, thank you!

## 2019-01-08 DIAGNOSIS — M4646 Discitis, unspecified, lumbar region: Secondary | ICD-10-CM | POA: Diagnosis not present

## 2019-01-08 DIAGNOSIS — G061 Intraspinal abscess and granuloma: Secondary | ICD-10-CM | POA: Diagnosis not present

## 2019-01-08 DIAGNOSIS — Z4789 Encounter for other orthopedic aftercare: Secondary | ICD-10-CM | POA: Diagnosis not present

## 2019-01-09 ENCOUNTER — Telehealth: Payer: Self-pay | Admitting: Infectious Diseases

## 2019-01-09 NOTE — Telephone Encounter (Signed)
COVID-19 Pre-Screening Questions:  Do you currently have a fever (>100 F), chills or unexplained body aches?no   Are you currently experiencing new cough, shortness of breath, sore throat, runny nose? No     Have you recently travelled outside the state of [place] in the last 14 days? No    1. Have you been in contact with someone that is currently pending confirmation of Covid19 testing or has been confirmed to have the [place] virus?  No

## 2019-01-10 ENCOUNTER — Encounter: Payer: Self-pay | Admitting: Infectious Diseases

## 2019-01-10 ENCOUNTER — Other Ambulatory Visit: Payer: Self-pay

## 2019-01-10 ENCOUNTER — Ambulatory Visit (INDEPENDENT_AMBULATORY_CARE_PROVIDER_SITE_OTHER): Payer: 59 | Admitting: Infectious Diseases

## 2019-01-10 VITALS — BP 159/107 | HR 125 | Temp 97.4°F | Wt 167.0 lb

## 2019-01-10 DIAGNOSIS — N179 Acute kidney failure, unspecified: Secondary | ICD-10-CM | POA: Diagnosis not present

## 2019-01-10 DIAGNOSIS — D72829 Elevated white blood cell count, unspecified: Secondary | ICD-10-CM

## 2019-01-10 DIAGNOSIS — A4902 Methicillin resistant Staphylococcus aureus infection, unspecified site: Secondary | ICD-10-CM | POA: Diagnosis not present

## 2019-01-10 DIAGNOSIS — M4620 Osteomyelitis of vertebra, site unspecified: Secondary | ICD-10-CM | POA: Diagnosis not present

## 2019-01-10 NOTE — Patient Instructions (Addendum)
Schedule f/u appointment with neurosurgeon (Dr. Kary Kos. [phone]) to have stitches removed. Continue benadryl as pre-medication 30 minutes before daptomycin dose. Stop smoking cigarettes.

## 2019-01-10 NOTE — Progress Notes (Signed)
Subjective:    Patient ID: Thomas Simpson, male    DOB: [birth year], 58 y.o.   MRN: [id number]  HPI The patient is a 58 year old white male smoker who is presenting today for follow-up from 2 recent admissions to the hospital for MRSA vertebral osteomyelitis with epidural abscess and subsequent renal failure while on vancomycin.  He underwent vertebral discectomy as an outpatient and then was readmitted with an epidural abscess and MRSA vertebral osteomyelitis short time later.  He was just discharged from [facility] Jan 01, 2019 after developing renal failure while on vancomycin.  While hospitalized, his antibiotics were changed to IV daptomycin dosed at 8 mgs per kilogram.  While his creatinine remains elevated, his urine output has continued to improve at home.  He does admit to some ongoing back pain but states that this is much better than it was prior to his first admission with infection.  He has been adhering to his IV antibiotics and avoiding showers while his PICC line is intact.  He denies any systemic fevers or chills at today's visit.  The stitches still remain intact to his lumbar wound as he has not seen his neurosurgeon since his debridement surgery.  Unfortunately, he continues to smoke cigarettes.   Past Medical History:  Diagnosis Date  . Abscess 11/2018   LUMBAR  . Asthma   . Epidural abscess    L3-4    Past Surgical History:  Procedure Laterality Date  . BACK SURGERY  03/24/2018  . LUMBAR LAMINECTOMY/DECOMPRESSION MICRODISCECTOMY Left 12/13/2018   Procedure: Left Lumbar three-four Laminectomy for epidural abscess;  Surgeon: Kary Kos, MD;  Location: [place];  Service: Neurosurgery;  Laterality: Left;     No family history on file.   Social History   Tobacco Use  . Smoking status: Current Every Day Smoker    Packs/day: 1.00    Years: 30.00    Pack years: 30.00    Types: Cigarettes  . Smokeless tobacco: Never Used  Substance Use Topics  . Alcohol use:  Never    Frequency: Never  . Drug use: Never      has no history on file for sexual activity.   Outpatient Medications Prior to Visit  Medication Sig Dispense Refill  . albuterol (PROAIR HFA) 108 (90 Base) MCG/ACT inhaler Inhale 2 puffs into the lungs every 6 (six) hours as needed for wheezing or shortness of breath.    . Ascorbic Acid (VITAMIN C GUMMIE PO) Take 2 tablets by mouth daily after breakfast.    . cholecalciferol (VITAMIN D3) 25 MCG (1000 UT) tablet Take 1,000 Units by mouth daily.    . daptomycin (CUBICIN) IVPB Inject 600 mg into the vein daily. Indication:  MRSA discitis and abscess  Last Day of Therapy: 02/06/19 Labs - Once weekly:  CBC/D, BMP, and CPK Labs - Every other week:  ESR and CRP 35 Units 0  . dexlansoprazole (DEXILANT) 60 MG capsule Take 60 mg by mouth every morning.     [place] Omega-3 Krill Oil 500 MG CAPS Take 500 mg elemental calcium/kg/hr by mouth daily with breakfast.      No facility-administered medications prior to visit.      No Known Allergies    Review of Systems  Constitutional: Positive for fatigue. Negative for chills and fever.  HENT: Negative for congestion, hearing loss, rhinorrhea and sinus pressure.   Eyes: Negative for photophobia, pain, redness and visual disturbance.  Respiratory: Negative for apnea, cough, shortness of breath  and wheezing.   Cardiovascular: Negative for chest pain and palpitations.  Gastrointestinal: Negative for abdominal pain, constipation, diarrhea, nausea and vomiting.  Endocrine: Negative for cold intolerance, heat intolerance, polydipsia and polyuria.  Genitourinary: Negative for decreased urine volume, dysuria, frequency, hematuria and testicular pain.  Musculoskeletal: Positive for back pain. Negative for neck pain.  Skin: Negative for pallor and rash.  Allergic/Immunologic: Negative for immunocompromised state.  Neurological: Negative for dizziness, seizures, syncope, speech difficulty and  light-headedness.  Hematological: Does not bruise/bleed easily.  Psychiatric/Behavioral: Negative for agitation and hallucinations. The patient is nervous/anxious.        Objective:    Vitals:   01/10/19 1124  BP: (!) 159/107  Pulse: (!) 125  Temp: (!) 97.4 F (36.3 C)   Physical Exam Gen: pleasant, NAD, A&Ox 3 Head: NCAT, no temporal wasting evident EENT: PERRL, EOMI, MMM, adequate dentition Neck: supple, no JVD CV: tachycardic, RR, no murmurs evident Pulm: CTA bilaterally, no wheeze or retractions Abd: soft, NTND, +BS Extrems: 1+ non-pitting LE edema, 2+ pulses  MSK: lumbar wound c/d/i with stitches still intact and no drainage Skin: no rashes, adequate skin turgor Neuro: CN II-XII grossly intact, no focal neurologic deficits appreciated, gait was slowed but OTW WNL, A&Ox 3   Labs: Lab Results  Component Value Date   WBC 18.2 (H) 01/04/2019   HGB 10.3 (L) 01/04/2019   HCT 30.5 (L) 01/04/2019   MCV 86.4 01/04/2019   PLT 473 (H) 01/04/2019   Lab Results  Component Value Date   NA 138 01/04/2019   K 3.6 01/04/2019   CL 100 01/04/2019   CO2 25 01/04/2019   GLUCOSE 114 (H) 01/04/2019   BUN 18 01/04/2019   CREATININE 2.27 (H) 01/04/2019   CALCIUM 8.9 01/04/2019   MG 1.2 (L) 01/02/2019   Lab Results  Component Value Date   CRP 17.7 (H) 12/29/2018       Assessment & Plan:  The patient is a 58 y/o WM smoker with h/o lumbar microdiscectomy in 8/19 for radiculopathy who recently was receiving epidural steroid injections x 2 now with an epidural abscess and L4-L5 discitis with possible osteomyelitis and recent ARF.  1. Epidural abscess/discitis - Patient did have full healing after his initial microdiscectomy and denies any drainage chronically from his surgical site last summer. MRSA was confirmed on operative cxs, so pt was initially discharged on vancomycin. I anticipate an 8-week course of likely IV antibiotics for treatment medically of his condition.  Unfortunately, his vancomycin level became supratherapeutic to 75 and he developed renal failure. As IVFs as an OP failed to improve his Cr, he was re-admitted to [facility] with work up. His vancomycin was changed to daptomycin dosed at 8 mg/kg IV daily. His tentative ABX d/c date is 02/06/19.  Given the wedge deformity noted on his recent MRI, the patient may benefit from a back brace while receiving ongoing medical therapy. Will defer this issue to his neurosurgeons. He has not seen his neurosurgeon since his initial hospital d/c.  2.  Acute renal failure- patient's creatinine has been improving, now down to 2.2.  He was taken off vancomycin as a result of this issue  3. Leukocytosis-this is likely reactive from the patient's newly discovered epidural abscess and vertebral discitis. Will adjust IV antibiotics as noted above. Will repeat the patient's CBC with differential weekly as an OP. Monitor bowel motility closely given concern for narcotic ileus.  4. Tobacco abuse -briefly discussed the relationship between ongoing nicotine use and  vasoconstriction which often leads to poor wound healing and increased risk for subsequent infection. Patient's initial wound from his surgery last summer however did heal completely. I have encouraged the patient to consider smoking cessation in total perhaps with medication adjunctive assistance if needed in order to reduce his risk for ongoing infection.

## 2019-01-11 DIAGNOSIS — G061 Intraspinal abscess and granuloma: Secondary | ICD-10-CM | POA: Diagnosis not present

## 2019-01-11 DIAGNOSIS — N141 Nephropathy induced by other drugs, medicaments and biological substances: Secondary | ICD-10-CM | POA: Diagnosis not present

## 2019-01-11 DIAGNOSIS — N179 Acute kidney failure, unspecified: Secondary | ICD-10-CM | POA: Diagnosis not present

## 2019-01-11 LAB — FUNGUS CULTURE RESULT

## 2019-01-11 LAB — FUNGUS CULTURE WITH STAIN

## 2019-01-11 LAB — FUNGAL ORGANISM REFLEX

## 2019-01-14 ENCOUNTER — Other Ambulatory Visit
Admission: RE | Admit: 2019-01-14 | Discharge: 2019-01-14 | Disposition: A | Discharge status: Home / Self Care | Payer: 59 | Source: Skilled Nursing Facility | Attending: Infectious Diseases | Admitting: Infectious Diseases

## 2019-01-14 LAB — BASIC METABOLIC PANEL
Anion gap: 13 (ref 5–15)
BUN: 19 mg/dL (ref 6–20)
CO2: 22 mmol/L (ref 22–32)
Calcium: 8.7 mg/dL — ABNORMAL LOW (ref 8.9–10.3)
Chloride: 106 mmol/L (ref 98–111)
Creatinine, Ser: 1.53 mg/dL — ABNORMAL HIGH (ref 0.61–1.24)
GFR calc Af Amer: 58 mL/min — ABNORMAL LOW (ref >60)
GFR calc non Af Amer: 50 mL/min — ABNORMAL LOW (ref >60)
Glucose, Bld: 86 mg/dL (ref 70–99)
Potassium: 3.8 mmol/L (ref 3.5–5.1)
Sodium: 141 mmol/L (ref 135–145)

## 2019-01-14 LAB — HEPATIC FUNCTION PANEL
ALT: 20 U/L (ref 0–44)
AST: 19 U/L (ref 15–41)
Albumin: 3.4 g/dL — ABNORMAL LOW (ref 3.5–5.0)
Alkaline Phosphatase: 117 U/L (ref 38–126)
Bilirubin, Direct: <0.1 mg/dL (ref 0.0–0.2)
Total Bilirubin: 0.2 mg/dL — ABNORMAL LOW (ref 0.3–1.2)
Total Protein: 7.3 g/dL (ref 6.5–8.1)

## 2019-01-14 LAB — CBC WITH DIFFERENTIAL/PLATELET
Abs Immature Granulocytes: 0.1 10*3/uL — ABNORMAL HIGH (ref 0.00–0.07)
Basophils Absolute: 0.2 10*3/uL — ABNORMAL HIGH (ref 0.0–0.1)
Basophils Relative: 1 %
Eosinophils Absolute: 0.4 10*3/uL (ref 0.0–0.5)
Eosinophils Relative: 2 %
HCT: 34.5 % — ABNORMAL LOW (ref 39.0–52.0)
Hemoglobin: 11.6 g/dL — ABNORMAL LOW (ref 13.0–17.0)
Immature Granulocytes: 1 %
Lymphocytes Relative: 19 %
Lymphs Abs: 3.3 10*3/uL (ref 0.7–4.0)
MCH: 29.4 pg (ref 26.0–34.0)
MCHC: 33.6 g/dL (ref 30.0–36.0)
MCV: 87.3 fL (ref 80.0–100.0)
Monocytes Absolute: 1.1 10*3/uL — ABNORMAL HIGH (ref 0.1–1.0)
Monocytes Relative: 6 %
Neutro Abs: 11.8 10*3/uL — ABNORMAL HIGH (ref 1.7–7.7)
Neutrophils Relative %: 71 %
Platelets: 385 10*3/uL (ref 150–400)
RBC: 3.95 MIL/uL — ABNORMAL LOW (ref 4.22–5.81)
RDW: 14.7 % (ref 11.5–15.5)
WBC: 16.8 10*3/uL — ABNORMAL HIGH (ref 4.0–10.5)
nRBC: 0 % (ref 0.0–0.2)

## 2019-01-14 LAB — CK: Total CK: 50 U/L (ref 49–397)

## 2019-01-16 ENCOUNTER — Inpatient Hospital Stay: Payer: 59 | Admitting: Infectious Diseases

## 2019-02-01 ENCOUNTER — Other Ambulatory Visit: Payer: Self-pay

## 2019-02-01 ENCOUNTER — Ambulatory Visit (INDEPENDENT_AMBULATORY_CARE_PROVIDER_SITE_OTHER): Payer: 59 | Admitting: Infectious Diseases

## 2019-02-01 ENCOUNTER — Encounter: Payer: Self-pay | Admitting: Infectious Diseases

## 2019-02-01 VITALS — BP 160/83 | HR 101 | Wt 173.0 lb

## 2019-02-01 DIAGNOSIS — M4620 Osteomyelitis of vertebra, site unspecified: Secondary | ICD-10-CM

## 2019-02-01 DIAGNOSIS — N179 Acute kidney failure, unspecified: Secondary | ICD-10-CM | POA: Diagnosis not present

## 2019-02-01 DIAGNOSIS — A4902 Methicillin resistant Staphylococcus aureus infection, unspecified site: Secondary | ICD-10-CM

## 2019-02-01 DIAGNOSIS — D72829 Elevated white blood cell count, unspecified: Secondary | ICD-10-CM | POA: Diagnosis not present

## 2019-02-01 NOTE — Patient Instructions (Signed)
Pt to call Dr. Saintclair Halsted and arrange follow up visit with neurosurgeon. He is to call our office back and tell us when his appointment is with them, so we can determine if daptomycin needs to be extended beyond tentative stop date of 02/06/2019.

## 2019-02-01 NOTE — Progress Notes (Signed)
Subjective:    Patient ID: Thomas Simpson, male    DOB: 10-22-1960, 58 y.o.   MRN: [id number]  HPI: The patient is a 58 year old white male vertebral osteomyelitis secondary to MRSA who presents for a routine clinic follow up visit. Following his first admission with infection, he was sent home on vancomycin and developed acute renal failure requiring readmission and was ultimately transitioned to IV daptomycin instead for treatment.  He was last seen in our office on Jan 10, 2019.  Since that visit he is seen his surgeon and his stitches to his wound site have been removed successfully.  His most recent laboratories collected on January 29, 2019 shows a reduction in his white blood cell count to 11,600, normalization of his creatinine at 1.11, normal CK at 100.  Unfortunately, the CRP ordered does not appear to have been collected.  He underwent vertebral discectomy as an outpatient and then was readmitted with an epidural abscess and MRSA vertebral osteomyelitis short time later.  He was  discharged from [facility] Jan 01, 2019 after developing renal failure while on vancomycin.  While hospitalized, his antibiotics were changed to IV daptomycin dosed at 8 mgs per kilogram.  While his creatinine remains elevated, his urine output has continued to improve at home.  He does admit to some ongoing back pain but states that this is much better than it was prior to his first admission with infection.  He has been adhering to his IV antibiotics and avoiding showers while his PICC line is intact.  He denies any systemic fevers or chills at today's visit. Unfortunately, he continues to smoke cigarettes.    Past Medical History:  Diagnosis Date  . Abscess 11/2018   LUMBAR  . Asthma   . Epidural abscess    L3-4    Past Surgical History:  Procedure Laterality Date  . BACK SURGERY  03/24/2018  . LUMBAR LAMINECTOMY/DECOMPRESSION MICRODISCECTOMY Left 12/13/2018   Procedure: Left Lumbar three-four  Laminectomy for epidural abscess;  Surgeon: Kary Kos, MD;  Location: [place];  Service: Neurosurgery;  Laterality: Left;     No family history on file.   Social History   Tobacco Use  . Smoking status: Current Every Day Smoker    Packs/day: 1.00    Years: 30.00    Pack years: 30.00    Types: Cigarettes  . Smokeless tobacco: Never Used  Substance Use Topics  . Alcohol use: Never    Frequency: Never  . Drug use: Never      has no history on file for sexual activity.   Outpatient Medications Prior to Visit  Medication Sig Dispense Refill  . albuterol (PROAIR HFA) 108 (90 Base) MCG/ACT inhaler Inhale 2 puffs into the lungs every 6 (six) hours as needed for wheezing or shortness of breath.    . Ascorbic Acid (VITAMIN C GUMMIE PO) Take 2 tablets by mouth daily after breakfast.    . cholecalciferol (VITAMIN D3) 25 MCG (1000 UT) tablet Take 1,000 Units by mouth daily.    . daptomycin (CUBICIN) IVPB Inject 600 mg into the vein daily. Indication:  MRSA discitis and abscess  Last Day of Therapy: 02/06/19 Labs - Once weekly:  CBC/D, BMP, and CPK Labs - Every other week:  ESR and CRP 35 Units 0  . dexlansoprazole (DEXILANT) 60 MG capsule Take 60 mg by mouth every morning.     [place] Omega-3 Krill Oil 500 MG CAPS Take 500 mg elemental calcium/kg/hr by mouth  daily with breakfast.      No facility-administered medications prior to visit.      No Known Allergies    Review of Systems  Constitutional: Positive for fatigue. Negative for chills and fever.  HENT: Negative for congestion, hearing loss, rhinorrhea and sinus pressure.   Eyes: Negative for photophobia, pain, redness and visual disturbance.  Respiratory: Negative for apnea, cough, shortness of breath and wheezing.   Cardiovascular: Negative for chest pain and palpitations.  Gastrointestinal: Negative for abdominal pain, constipation, diarrhea, nausea and vomiting.  Endocrine: Negative for cold intolerance, heat intolerance,  polydipsia and polyuria.  Genitourinary: Negative for decreased urine volume, dysuria, frequency, hematuria and testicular pain.  Musculoskeletal: Positive for back pain. Negative for myalgias and neck pain.  Skin: Negative for pallor and rash.  Allergic/Immunologic: Negative for immunocompromised state.  Neurological: Negative for dizziness, seizures, syncope, speech difficulty and light-headedness.  Hematological: Does not bruise/bleed easily.  Psychiatric/Behavioral: Negative for agitation and hallucinations. The patient is not nervous/anxious.       Objective:    Vitals:   02/01/19 1358  BP: (!) 160/83  Pulse: (!) 101   Physical Exam Gen: pleasant, NAD, A&Ox 3 Head: NCAT, no temporal wasting evident EENT: PERRL, EOMI, MMM, adequate dentition Neck: supple, no JVD CV: tachycardic, RR, no murmurs evident Pulm: CTA bilaterally, no wheeze or retractions Abd: soft, NTND, +BS Extrems: 1+ non-pitting LE edema, 2+ pulses  MSK: lumbar wound healing well Skin: no rashes, adequate skin turgor Neuro: CN II-XII grossly intact, no focal neurologic deficits appreciated, gait was slowed but OTW WNL, A&Ox 3   Labs: Lab Results  Component Value Date   WBC 16.8 (H) 01/14/2019   HGB 11.6 (L) 01/14/2019   HCT 34.5 (L) 01/14/2019   MCV 87.3 01/14/2019   PLT 385 01/14/2019   Lab Results  Component Value Date   NA 141 01/14/2019   K 3.8 01/14/2019   CL 106 01/14/2019   CO2 22 01/14/2019   GLUCOSE 86 01/14/2019   BUN 19 01/14/2019   CREATININE 1.53 (H) 01/14/2019   CALCIUM 8.7 (L) 01/14/2019   MG 1.2 (L) 01/02/2019   Lab Results  Component Value Date   CRP 17.7 (H) 12/29/2018          Assessment & Plan:  The patient is a 58 y/o WM smoker with h/o lumbar microdiscectomy in 8/19 for radiculopathy who recently was receiving epidural steroid injections x 2 now with an epidural abscess and L4-L5 discitis with possible osteomyelitis and recent ARF.  1. Epidural abscess/discitis -  Patient did have full healing after his initial microdiscectomy and denies any drainage chronically from his surgical site last summer.MRSA was confirmed on operative cxs, so pt was initially discharged on vancomycin. I anticipate an 8-week course of likely IV antibiotics for treatment medically of his condition. Unfortunately, his vancomycin level became supratherapeutic to 75 and he developed renal failure. As IVFs as an OP failed to improve his Cr, he was re-admitted to [facility] with work up. His vancomycin was changed to daptomycin dosed at 8 mg/kg IV daily. His tentative ABX d/c date is 02/20/19. Given the wedge deformity noted on his recent MRI, the patient may benefit from a back brace while receiving ongoing medical therapy. Will defer this issue to his neurosurgeon.   I have asked home health to ensure they are drawing a CRP weekly with his routine labs that we can best assess his response to antibiotic treatment.  His white blood cell count is  reduced significantly now to 11,000.   2.  Acute renal failure-  likely was vancomycin induced given the patient's random level of 75 as an outpatient.  His renal function has now returned to normal with a creatinine of 1.1 on his weekly labs from January 29, 2019.  3. Leukocytosis-this is likely reactive from the patient's newly discovered epidural abscess and vertebral discitis. AdjustedIV antibiotics as noted above. Willrepeat the patient's CBC with differential weekly as an OP. His white blood cell count is now 11,000 this week.  4. Tobacco abuse -briefly discussed the relationship between ongoing nicotine use and vasoconstriction which often leads to poor wound healing and increased risk for subsequent infection. Patient's initial wound from his surgery last summer however did heal completely. I have encouraged the patient to consider smoking cessation in total perhaps with medication adjunctive assistance if needed in order to reduce his  risk for ongoing infection.

## 2019-02-02 LAB — C-REACTIVE PROTEIN: CRP: 12 mg/L — ABNORMAL HIGH (ref <8.0)

## 2019-02-12 ENCOUNTER — Telehealth: Payer: Self-pay | Admitting: *Deleted

## 2019-02-12 NOTE — Telephone Encounter (Signed)
We'll address at his visit on the 24th then. Please ensure his antibiotics are extended until that date.

## 2019-02-12 NOTE — Telephone Encounter (Signed)
Corretta from [facility] called to ask about the patient PICC. It was to be D/C on 02/06/19 and he still has it with no medication. Advised will ask the provider and give her a call back.

## 2019-02-13 NOTE — Telephone Encounter (Signed)
[facility] is aware to hold the PICC until his visit here 02/14/19.

## 2019-02-14 ENCOUNTER — Other Ambulatory Visit: Payer: Self-pay

## 2019-02-14 ENCOUNTER — Encounter: Payer: Self-pay | Admitting: Infectious Diseases

## 2019-02-14 ENCOUNTER — Ambulatory Visit (INDEPENDENT_AMBULATORY_CARE_PROVIDER_SITE_OTHER): Payer: 59 | Admitting: Infectious Diseases

## 2019-02-14 VITALS — BP 156/88 | HR 89 | Ht 70.0 in | Wt 179.0 lb

## 2019-02-14 DIAGNOSIS — N179 Acute kidney failure, unspecified: Secondary | ICD-10-CM | POA: Diagnosis not present

## 2019-02-14 DIAGNOSIS — M4620 Osteomyelitis of vertebra, site unspecified: Secondary | ICD-10-CM | POA: Diagnosis not present

## 2019-02-14 DIAGNOSIS — A4902 Methicillin resistant Staphylococcus aureus infection, unspecified site: Secondary | ICD-10-CM | POA: Diagnosis not present

## 2019-02-14 DIAGNOSIS — D72829 Elevated white blood cell count, unspecified: Secondary | ICD-10-CM

## 2019-02-14 NOTE — Progress Notes (Signed)
Subjective:    Patient ID: Thomas Simpson, male    DOB: 1961-03-07, 58 y.o.   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 Following his first admission with infection, he was sent home on vancomycin and developed acute renal failure requiring readmission and was ultimately transitioned to IV daptomycin instead for treatment.  He was last seen in our office on February 01, 2019.  His most recent laboratories collected on February 05, 2019 shows a reduction in his white blood cell count to 10,500, normalization of his creatinine at 0.86, normal CK at 82, and normal CRP at 8 (reference range 0-10).  He underwent vertebral discectomy as an outpatient and then was readmitted with an epidural abscess and MRSA vertebral osteomyelitis short time later. He was  discharged from [facility] Jan 01, 2019 after developing renal failure while on vancomycin. While hospitalized, his antibiotics were changed to IV daptomycin dosed at 8 mgsper kilogram.While his creatinine remains elevated, his urine output has continued to improve at home. He does admit to some ongoing back pain but states that this is much better than it was prior to his first admission with infection. He has been adhering to his IV antibiotics and avoiding showers while his PICC line is intact. He completed his last daptomycin dose this morning and is now anxious to have his PICC removed. Unfortunately, he continues to smoke cigarettes.     Past Medical History:  Diagnosis Date  . Abscess 11/2018   LUMBAR  . Asthma   . Epidural abscess    L3-4    Past Surgical History:  Procedure Laterality Date  . BACK SURGERY  03/24/2018  . LUMBAR LAMINECTOMY/DECOMPRESSION MICRODISCECTOMY Left 12/13/2018   Procedure: Left Lumbar three-four Laminectomy for epidural abscess;  Surgeon: Kary Kos, MD;  Location: [place];  Service: Neurosurgery;   Laterality: Left;     No family history on file.   Social History   Tobacco Use  . Smoking status: Current Every Day Smoker    Packs/day: 1.00    Years: 30.00    Pack years: 30.00    Types: Cigarettes  . Smokeless tobacco: Never Used  Substance Use Topics  . Alcohol use: Never    Frequency: Never  . Drug use: Never      has no history on file for sexual activity.   Outpatient Medications Prior to Visit  Medication Sig Dispense Refill  . albuterol (PROAIR HFA) 108 (90 Base) MCG/ACT inhaler Inhale 2 puffs into the lungs every 6 (six) hours as needed for wheezing or shortness of breath.    . Ascorbic Acid (VITAMIN C GUMMIE PO) Take 2 tablets by mouth daily after breakfast.    . cholecalciferol (VITAMIN D3) 25 MCG (1000 UT) tablet Take 1,000 Units by mouth daily.    Marland Kitchen dexlansoprazole (DEXILANT) 60 MG capsule Take 60 mg by mouth every morning.     Marnee [place] Omega-3 Krill Oil 500 MG CAPS Take 500 mg elemental calcium/kg/hr by mouth daily with breakfast.      No facility-administered medications prior to visit.      No Known Allergies    Review of Systems  Constitutional: Positive for fatigue. Negative for chills and fever.  HENT: Negative for congestion, hearing loss, rhinorrhea and sinus pressure.   Eyes: Negative for photophobia, pain, redness and visual disturbance.  Respiratory: Negative for apnea, cough, shortness of breath and  wheezing.   Cardiovascular: Negative for chest pain and palpitations.  Gastrointestinal: Negative for abdominal pain, constipation, diarrhea, nausea and vomiting.  Endocrine: Negative for cold intolerance, heat intolerance, polydipsia and polyuria.  Genitourinary: Negative for decreased urine volume, dysuria, frequency, hematuria and testicular pain.  Musculoskeletal: Positive for back pain. Negative for myalgias and neck pain.  Skin: Negative for pallor and rash.  Allergic/Immunologic: Negative for immunocompromised state.  Neurological:  Negative for dizziness, seizures, syncope, speech difficulty and light-headedness.  Hematological: Does not bruise/bleed easily.  Psychiatric/Behavioral: Negative for agitation and hallucinations. The patient is nervous/anxious.        Objective:    Vitals:   02/14/19 1425  BP: (!) 156/88  Pulse: 89   Physical Exam Gen: pleasant, NAD, A&Ox 3 Head: NCAT, no temporal wasting evident EENT: PERRL, EOMI, MMM, adequate dentition Neck: supple, no JVD VW:UJWJ, no murmurs evident Pulm: CTA bilaterally, no wheeze or retractions Abd: soft, NTND, +BS Extrems: 1+ non-pittingLE edema, 2+ pulses MSK: lumbar wound well healed Skin: no rashes, adequate skin turgor Neuro: CN II-XII grossly intact, no focal neurologic deficits appreciated, gait wasslowed but OTW WNL, A&Ox 3    Labs: Lab Results  Component Value Date   WBC 16.8 (H) 01/14/2019   HGB 11.6 (L) 01/14/2019   HCT 34.5 (L) 01/14/2019   MCV 87.3 01/14/2019   PLT 385 01/14/2019   Lab Results  Component Value Date   NA 141 01/14/2019   K 3.8 01/14/2019   CL 106 01/14/2019   CO2 22 01/14/2019   GLUCOSE 86 01/14/2019   BUN 19 01/14/2019   CREATININE 1.53 (H) 01/14/2019   CALCIUM 8.7 (L) 01/14/2019   MG 1.2 (L) 01/02/2019   Lab Results  Component Value Date   CRP 12.0 (H) 02/01/2019      Assessment & Plan:  The patient is a 58 y/o WM smoker with h/o lumbar microdiscectomy in 8/19 for radiculopathy who recently was receiving epidural steroid injections x 2 now with an epidural abscess and L4-L5 discitis with possible osteomyelitisand recent ARF.  1. Epidural abscess/discitis - Patient did have full healing after his initial microdiscectomy and denies any drainage chronically from his surgical site last summer.MRSAwasconfirmedon operative cxs,so pt was initially discharged onvancomycin. I anticipate an 8-week course of likely IV antibiotics for treatment medically of his condition.Unfortunately, his vancomycin  level became supratherapeutic to 75 and he developed renal failure. As IVFs as an OP failed to improve his Cr, he was re-admitted to [facility] with work up. His vancomycin was changed to daptomycin dosed at 8 mg/kg IV daily.  He has now met all goals of therapy clinically and his white blood cell count is now returned to normal and his CRP is normal at 8.  We will complete his antibiotics this morning.  The patient's PICC line was removed in the office today.  2. Acute renal failure- likely was vancomycin induced given the patient's random level of 75 as an outpatient.  His renal function has now returned to normal with a creatinine of 0.8 on his weekly labs from February 05, 2019.  3. Leukocytosis-this is likely reactive from the patient's newly discovered epidural abscess and vertebral discitis. AdjustedIV antibiotics as noted above. Willrepeat the patient's CBC with differentialweekly as an OP. His white blood cell count is now 10,500 this week.  4. Tobacco abuse -briefly discussed the relationship between ongoing nicotine use and vasoconstriction which often leads to poor wound healing and increased risk for subsequent infection. Patient's initial wound  from his surgery last summer however did heal completely. I have encouraged the patient to consider smoking cessation in total perhaps with medication adjunctive assistance if needed in order to reduce his risk for ongoing infection.

## 2019-02-14 NOTE — Telephone Encounter (Signed)
PICC pulled in office as [facility] has closed the patient and will not reopen his case.

## 2019-02-14 NOTE — Patient Instructions (Signed)
Stop smoking cigarettes. Bathe former PICC line site as instructed.

## 2019-02-14 NOTE — Progress Notes (Signed)
Per verbal order from Dr Prince Rome, 43 cm Single Lumen Peripherally Inserted Central Catheter removed from right basilic, tip intact. No sutures present. RN confirmed length per chart. Dressing was clean and dry. Petroleum dressing applied. Pt advised no heavy lifting with this arm, leave dressing for 24 hours and call the office or seek emergent care if dressing becomes soaked with blood or swelling or sharp pain presents. Patient verbalized understanding and agreement.  Patient's questions answered to their satisfaction. Patient tolerated procedure well, RN walked patient to check out. RN notified Debbie at [employer], Cassie at [employer]. Landis Gandy, RN

## 2019-02-21 ENCOUNTER — Other Ambulatory Visit: Payer: Self-pay | Admitting: Neurosurgery

## 2019-02-21 DIAGNOSIS — M4646 Discitis, unspecified, lumbar region: Secondary | ICD-10-CM

## 2019-03-20 ENCOUNTER — Ambulatory Visit
Admission: RE | Admit: 2019-03-20 | Discharge: 2019-03-20 | Disposition: A | Discharge status: Home / Self Care | Payer: 59 | Source: Ambulatory Visit | Attending: Neurosurgery | Admitting: Neurosurgery

## 2019-03-20 DIAGNOSIS — M4646 Discitis, unspecified, lumbar region: Secondary | ICD-10-CM

## 2019-03-20 MED ORDER — GADOBENATE DIMEGLUMINE 529 MG/ML IV SOLN
17.0000 mL | Freq: Once | INTRAVENOUS | Status: AC | PRN
  Administered 2019-03-20: 17 mL via INTRAVENOUS

## 2019-04-27 IMAGING — MR MRI LUMBAR SPINE WITHOUT AND WITH CONTRAST
4 of 7 series · 20 of 48 positions shown · IV contrast (multihance)
Comparison: MR lumbar spine dated September 05, 2018.

CLINICAL DATA: Severe low back pain radiating down the left leg for
the past 2 months. Prior surgery in March 2018.

EXAM:
MRI LUMBAR SPINE WITHOUT AND WITH CONTRAST
TECHNIQUE: Multiplanar and multiecho pulse sequences of the lumbar spine were
obtained without and with intravenous contrast.
CONTRAST:  17mL MULTIHANCE GADOBENATE DIMEGLUMINE 529 MG/ML IV SOLN

[Series 6: T1 · sagittal · 4.0mm · 0.73mm/px · 3 of 15 slices shown (1 of 2)]
[im 1/15]
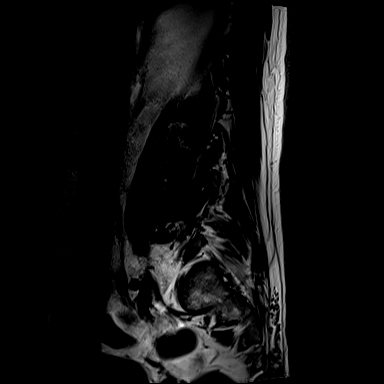
[im 8/15]
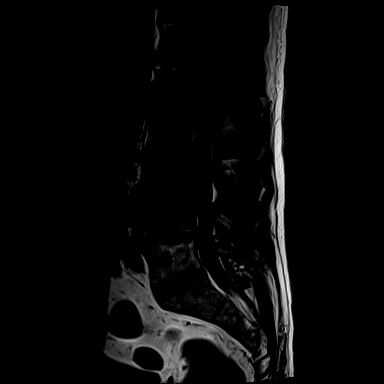
[im 15/15]
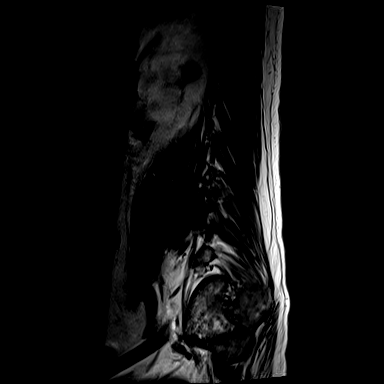

[Series 14: T2 · axial · 4.0mm · 0.35mm/px · z∈[-124,+111]mm · 11 of 44 slices shown (1 of 2)]
[im 1/44]
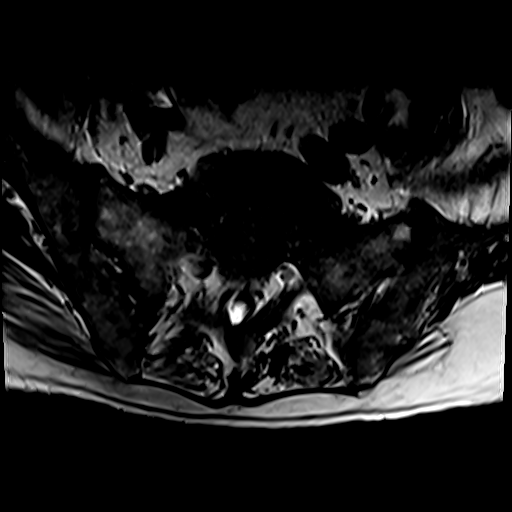
[im 5/44]
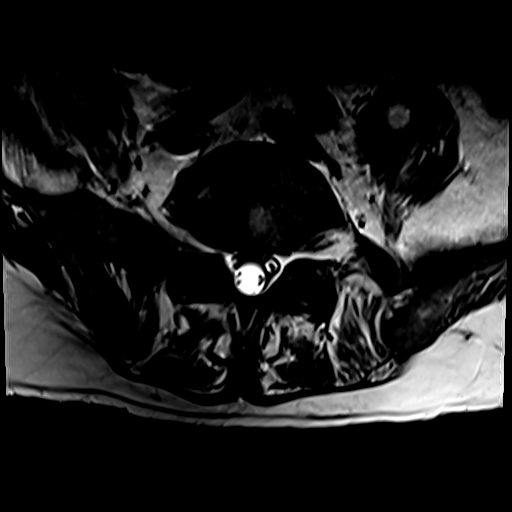
[im 9/44]
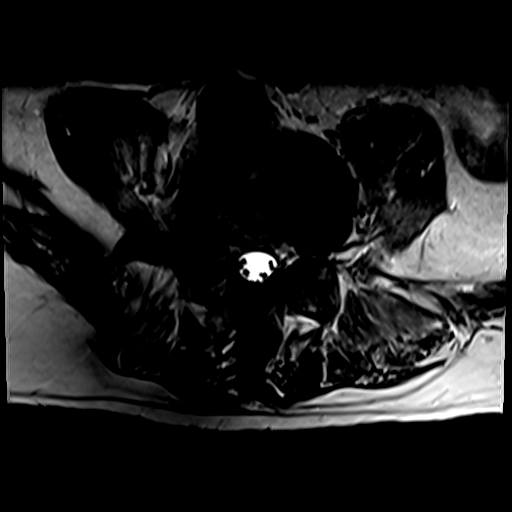
[im 13/44]
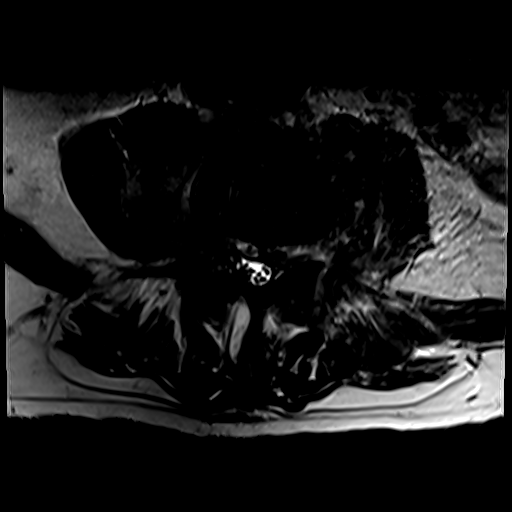
[im 18/44]
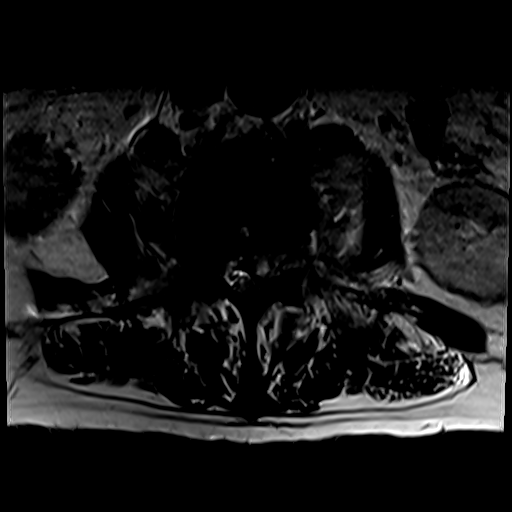
[im 22/44]
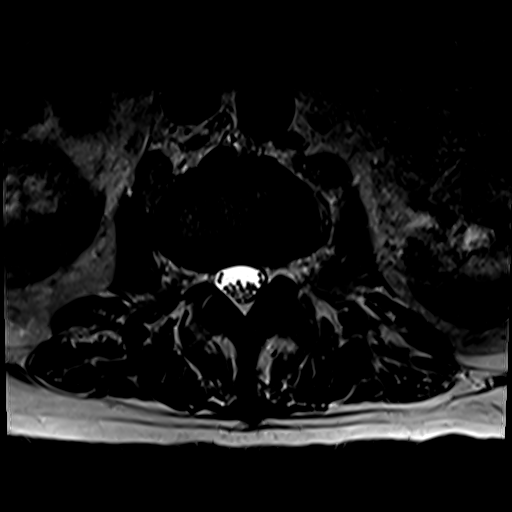
[im 26/44]
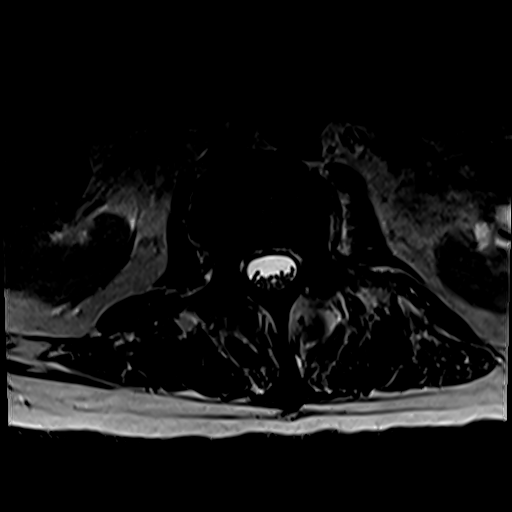
[im 31/44]
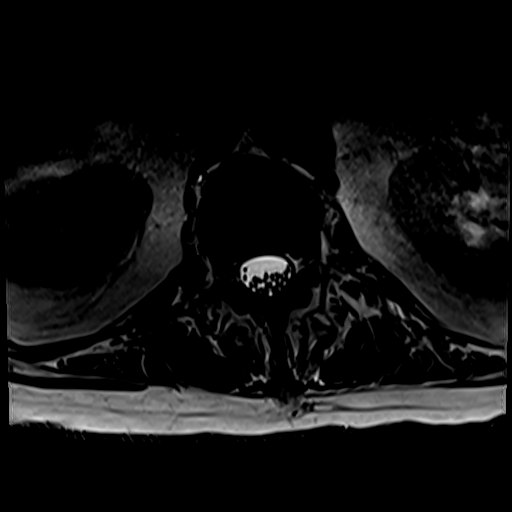
[im 35/44]
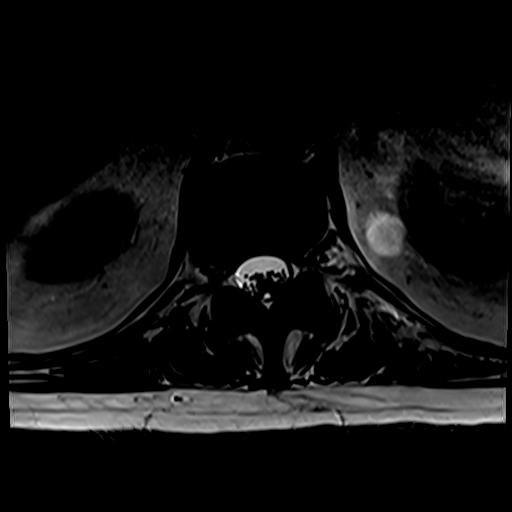
[im 39/44]
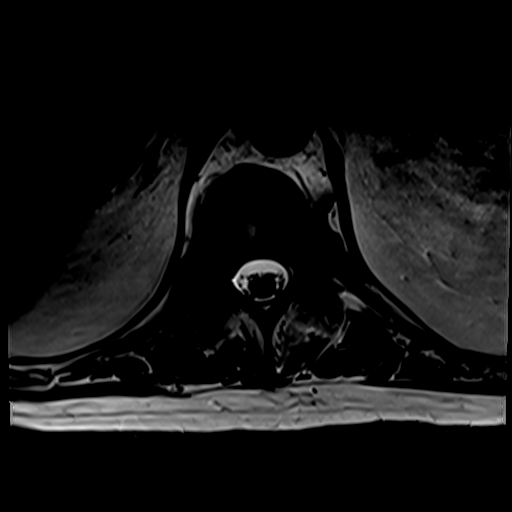
[im 44/44]
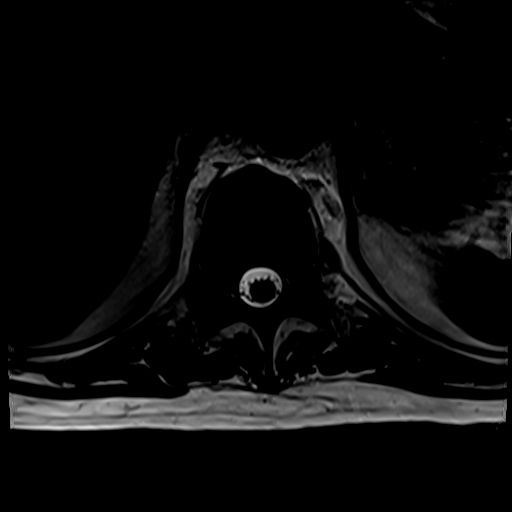

[Series 17: T1 · axial · 4.0mm · 0.35mm/px · z∈[-105,+87]mm · 3 of 44 slices shown (2 of 2)]
[im 5/44]
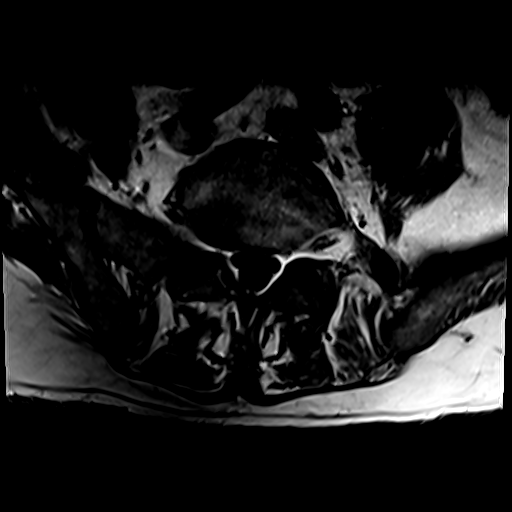
[im 22/44]
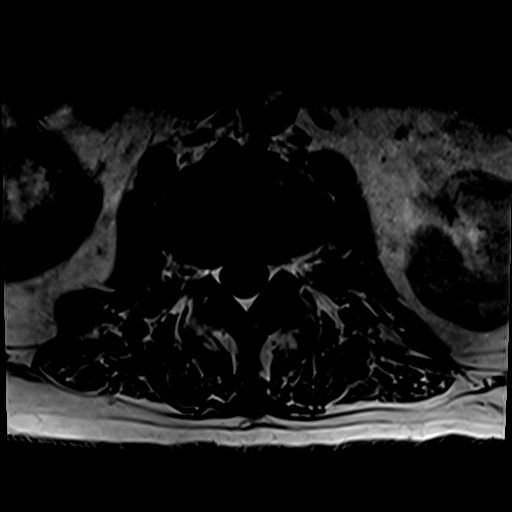
[im 39/44]
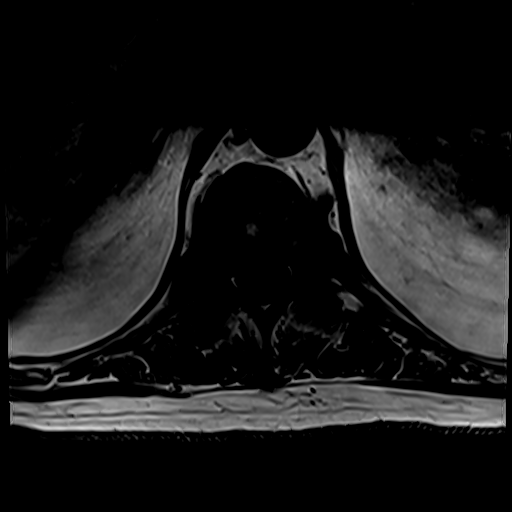

[Series 18: T2 · sagittal · 4.0mm · 0.73mm/px · 3 of 15 slices shown (2 of 2)]
[im 1/15]
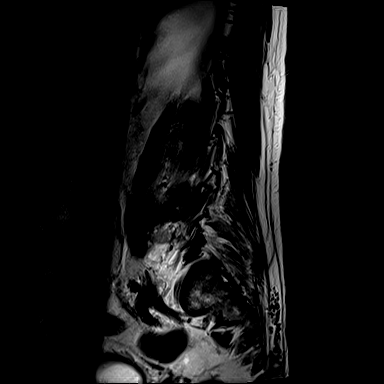
[im 10/15]
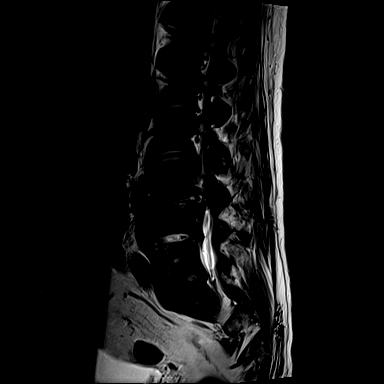
[im 15/15]
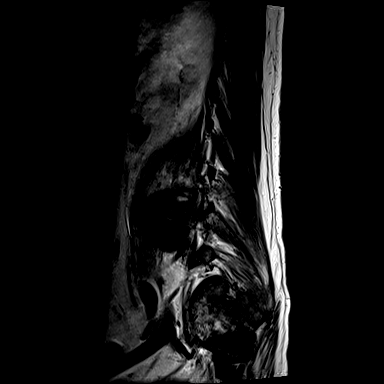

[20 of 48 positions shown; findings below may reference images not displayed]

FINDINGS: Segmentation:  Standard.

Alignment: Unchanged mild dextroscoliosis. Sagittal alignment is
maintained.

Vertebrae: New osteomyelitis discitis at L3-L4 with fluid and
enhancement of the disc space, erosion of the endplates, and
confluent marrow edema with decreased T1 marrow signal throughout
both L3 and L4 vertebral bodies. Mild right-sided endplate marrow
edema and enhancement at L4-L5 is unchanged likely degenerative. No
fracture or suspicious bone lesion.

Conus medullaris and cauda equina: Conus extends to the L1 level.
Conus and cauda equina appear normal. No intradural enhancement.

Paraspinal and other soft tissues: Prominent paravertebral
inflammatory changes at L3-L4 extending into both psoas muscles with
tiny 5 mm rim enhancing fluid collection in the left psoas muscle.
There is a larger incompletely visualized 1.6 cm abscess in the
inferior left psoas muscle at the level of the sacral ala. Prominent
anterior epidural phlegmon extending from the superior L3 endplate
to the inferior L4 endplate with small 6 x 3 x 8 mm epidural abscess
on the left behind the L3 vertebral body.

Disc levels:

T12-L1:  Negative.

L1-L2:  Negative.

L2-L3: Negative disc. New severe left lateral recess stenosis due to
epidural phlegmon. No spinal canal or neuroforaminal stenosis.

L3-L4: New severe spinal canal and bilateral lateral recess stenosis
due to disc bulging and anterior epidural phlegmon. Progressive
moderate to severe left and severe right neuroforaminal stenosis.

L4-L5: Prior right hemilaminectomy. Slightly decreased enhancing
granulation tissue along the right epidural space extending into the
right subarticular zone inferiorly, adjacent to the descending right
L5 nerve root as it exits the thecal sac. There is a new shallow
left subarticular and foraminal disc protrusion. Unchanged mild
bilateral facet arthropathy. New mild to moderate left lateral
recess stenosis. Unchanged moderate right and mild-to-moderate left
neuroforaminal stenosis. Resolved spinal canal stenosis.

L5-S1: Unchanged shallow broad-based posterior disc protrusion with
annular fissure. No stenosis.
IMPRESSION: 1. New osteomyelitis discitis at L3-L4 with prominent anterior
epidural phlegmon and small 6 x 3 x 8 mm epidural abscess resulting
in severe spinal canal and lateral recess stenosis with progressive
moderate to severe left and severe right neuroforaminal stenosis.
Additional new severe left lateral recess stenosis at L2-L3 due to
epidural phlegmon.
2. Prominent paravertebral inflammatory changes at L3-L4 with tiny 5
mm abscess in the left psoas muscle at this level and larger,
incompletely visualized 1.6 cm abscess in the inferior left psoas
muscle at the level of the sacral ala.
3. Decreasing granulation tissue in the surgical bed and right
subarticular zone at L4-L5. New shallow left sided disc protrusion
at this level with new mild to moderate left lateral recess
stenosis. Unchanged moderate right and mild-to-moderate left
neuroforaminal stenosis.

These results will be called to the ordering clinician or
representative by the Radiologist Assistant, and communication
documented in the PACS or zVision Dashboard.
